# Patient Record
Sex: Male | Born: 1955
Health system: Southern US, Community
[De-identification: ages and names within clinical notes are randomized; demographics above are authoritative.]

## PROBLEM LIST (undated history)

## (undated) DIAGNOSIS — M779 Enthesopathy, unspecified: Secondary | ICD-10-CM

## (undated) DIAGNOSIS — K5790 Diverticulosis of intestine, part unspecified, without perforation or abscess without bleeding: Secondary | ICD-10-CM

## (undated) DIAGNOSIS — Z860100 Personal history of colon polyps, unspecified: Secondary | ICD-10-CM

## (undated) DIAGNOSIS — M1990 Unspecified osteoarthritis, unspecified site: Secondary | ICD-10-CM

## (undated) DIAGNOSIS — S83519A Sprain of anterior cruciate ligament of unspecified knee, initial encounter: Secondary | ICD-10-CM

## (undated) DIAGNOSIS — T7840XA Allergy, unspecified, initial encounter: Secondary | ICD-10-CM

## (undated) DIAGNOSIS — Z8719 Personal history of other diseases of the digestive system: Secondary | ICD-10-CM

## (undated) DIAGNOSIS — Z98811 Dental restoration status: Secondary | ICD-10-CM

## (undated) DIAGNOSIS — M545 Low back pain, unspecified: Secondary | ICD-10-CM

## (undated) DIAGNOSIS — C4491 Basal cell carcinoma of skin, unspecified: Secondary | ICD-10-CM

## (undated) DIAGNOSIS — J302 Other seasonal allergic rhinitis: Secondary | ICD-10-CM

## (undated) DIAGNOSIS — K219 Gastro-esophageal reflux disease without esophagitis: Secondary | ICD-10-CM

## (undated) DIAGNOSIS — G8929 Other chronic pain: Secondary | ICD-10-CM

## (undated) DIAGNOSIS — M722 Plantar fascial fibromatosis: Secondary | ICD-10-CM

## (undated) DIAGNOSIS — N400 Enlarged prostate without lower urinary tract symptoms: Secondary | ICD-10-CM

## (undated) HISTORY — PX: UMBILICAL HERNIA REPAIR: SHX196

## (undated) HISTORY — DX: Gastro-esophageal reflux disease without esophagitis: K21.9

## (undated) HISTORY — PX: HERNIA REPAIR: SHX51

## (undated) HISTORY — DX: Allergy, unspecified, initial encounter: T78.40XA

## (undated) HISTORY — PX: COLONOSCOPY: SHX174

## (undated) HISTORY — PX: OTHER SURGICAL HISTORY: SHX169

---

## 1992-08-12 HISTORY — PX: LEG SURGERY: SHX1003

## 2002-09-23 ENCOUNTER — Ambulatory Visit (HOSPITAL_BASED_OUTPATIENT_CLINIC_OR_DEPARTMENT_OTHER): Admission: RE | Admit: 2002-09-23 | Discharge: 2002-09-23 | Payer: Self-pay | Admitting: Orthopedic Surgery

## 2002-09-23 HISTORY — PX: KNEE ARTHROSCOPY: SHX127

## 2004-06-06 ENCOUNTER — Ambulatory Visit (HOSPITAL_COMMUNITY): Admission: RE | Admit: 2004-06-06 | Discharge: 2004-06-06 | Payer: Self-pay | Admitting: Sports Medicine

## 2006-12-14 ENCOUNTER — Encounter: Admission: RE | Admit: 2006-12-14 | Discharge: 2006-12-14 | Payer: Self-pay | Admitting: Sports Medicine

## 2007-01-28 HISTORY — PX: TOTAL SHOULDER ARTHROPLASTY: SHX126

## 2007-01-29 ENCOUNTER — Ambulatory Visit (HOSPITAL_BASED_OUTPATIENT_CLINIC_OR_DEPARTMENT_OTHER): Admission: RE | Admit: 2007-01-29 | Discharge: 2007-01-29 | Payer: Self-pay | Admitting: Orthopedic Surgery

## 2007-01-29 HISTORY — PX: KNEE ARTHROSCOPY W/ ACL RECONSTRUCTION: SHX1858

## 2009-06-13 ENCOUNTER — Encounter (INDEPENDENT_AMBULATORY_CARE_PROVIDER_SITE_OTHER): Payer: Self-pay | Admitting: Plastic Surgery

## 2009-06-13 ENCOUNTER — Ambulatory Visit (HOSPITAL_BASED_OUTPATIENT_CLINIC_OR_DEPARTMENT_OTHER): Admission: RE | Admit: 2009-06-13 | Discharge: 2009-06-13 | Payer: Self-pay | Admitting: Plastic Surgery

## 2010-12-25 NOTE — Op Note (Signed)
NAMEDAMERE, BRANDENBURG NO.:  [id number]   MEDICAL RECORD NO.:  [id number]          PATIENT TYPE:  AMB   LOCATION:  DSC                          FACILITY:  [facility]   PHYSICIAN:  Loreta Ave, M.D. DATE OF BIRTH:  1955-10-15   DATE OF PROCEDURE:  01/28/2007  DATE OF DISCHARGE:                               OPERATIVE REPORT   PREOPERATIVE DIAGNOSIS:  End-stage degenerative arthritis, left  shoulder.   POSTOPERATIVE DIAGNOSIS:  End-stage degenerative arthritis, left  shoulder.   OPERATION/PROCEDURE:  Left total shoulder replacement utilizing a  Stryker prosthesis.  Cemented #5 glenoid component.  Press-fit 12 mm  humeral stem with a 40 x 18 mm head with a 4 mm offset to center this.   SURGEON:  Loreta Ave, M.D.   ASSISTANT:  Genene Churn. Barry Dienes, P.A. present throughout the entire case.   ANESTHESIA:  General.   ESTIMATED BLOOD LOSS:  150 mL.   BLOOD REPLACED:  None.   SPECIMENS:  None.   CULTURES:  None.   COMPLICATIONS:  None.   DRESSINGS:  Soft, compressive with shoulder immobilizer.   DESCRIPTION OF PROCEDURE:  The patient was brought to the operating room  and after adequate anesthesia had been obtained, left shoulder examined.  Under anesthesia, very reasonable motion, lacking only about 10% in all  planes.  No instability.  Placed in beach chair position with a shoulder  positioner and prepped and draped in the usual sterile fashion.  An  incision was made along the deltopectoral groove.  Skin and subcutaneous  tissue divided.  Cephalic vein identified and protected.  Retractor put  in place for retracting the conjoin tendon. Rotator cuff intact.  Subscapularis was taken down, tagged with FiberWire for later anatomic  repair.  Shoulder exposed.  Humeral head was removed at 30-degree of  retroversion in line with the definitive prosthesis. Sized for a 40 x 18  mm head.  Grade 4 changes.  Biceps tendon protected.  Appropriate  retractors for  exposure, exposing the glenoid which was relatively small  with grade 4 changes as well.  This was reamed down to good bleeding  bone.  Sized for #5 glenoid which was the smallest component.  It was  predrilled for the component.  The bottom peg just barely exited out the  bottom of the glenoid and has a small, nondisplaced, little crack at the  posterior of inferior aspect of the glenoid, which did not compromise  stability at all.  The wound was thoroughly irrigated.  The component  put in place and found to fit well with good stability and seating.  Once this was confirmed, shoulder was copiously irrigated and cemented,  and the glenoid component was firmly cemented in place.  Excessive  cement was removed. This allowed excellent stability of the small crack  at the bottom of the glenoid and did not require any further treatment  and did not affect component stability.  Attention was then turned to  the humerus.  Hand held reamers were up.  Sized for #12 stem.  After  appropriate  trials, a #12 mm stem was seated at 30 degrees retroversion.  I then centered appropriate trial and then the definitive head over the  top of the stem component and hammered it in place.  This gave excellent  coverage, height and fixation.  A shoulder reduced.  Good motion and  stability.  The wound was irrigated.  Subscapularis appeared  anatomically with FiberWire.  Retractors were removed.  Deltopectoral  interval closed with Vicryl and skin with Vicryl and staples.  Margins  were injected with Marcaine.  Sterile compressive dressing applied.  Shoulder immobilizer applied.  Anesthesia reversed.  Brought to the  recovery room.  Tolerated the surgery with no complications.      Loreta Ave, M.D.  Electronically Signed     DFM/MEDQ  D:  01/29/2007  T:  01/29/2007  Job:  [id number]

## 2010-12-25 NOTE — Op Note (Signed)
NAMEREGGIE, BISE NO.:  [id number]   MEDICAL RECORD NO.:  [id number]          PATIENT TYPE:  AMB   LOCATION:  DSC                          FACILITY:  [facility]   PHYSICIAN:  Loreta Ave, M.D. DATE OF BIRTH:  [birth year]   DATE OF PROCEDURE:  01/29/2007  DATE OF DISCHARGE:                               OPERATIVE REPORT   PREOPERATIVE DIAGNOSES:  1. Right knee anterior cruciate ligament tear with anterolateral      __________ instability.  2. Degenerative change medial compartment.   POSTOPERATIVE DIAGNOSES:  1. Right knee anterior cruciate ligament tear with      anterolateral__________ instability.  2. Degenerative change medial compartment.  3. Grade 2 and mild grade 3 thinning, medial compartment, mostly on      the tibial plateau.  4. Radial tears, mid portion lateral meniscus.   OPERATION/PROCEDURE:  1. Right knee examination under anesthesia.  2. Arthroscopy.  3. Debridement of lateral meniscus.  4. Arthroscopic endoscopic anterior cruciate ligament reconstruction.  5. Patella tendon allograft, bone-tendon-bone with bioabsorbable screw      fixation and notch plasty.  .   SURGEON:  Loreta Ave, M.D.   ASSISTANT:  Genene Churn. Barry Dienes, P.A. present throughout the entire case.   ANESTHESIA:  General.   ESTIMATED BLOOD LOSS:  Minimal.   TOURNIQUET TIME:  One hour.   SPECIMENS:  None.   CULTURES:  None.   DRESSINGS:  Soft, compressive with knee immobilizer.   DESCRIPTION OF PROCEDURE:  The patient was brought to the operating  table in the supine position.  After adequate anesthesia was obtained,  right knee was examined.  Positive Lachman's, positive drawer, positive  pivot change, trace amount of hyperextension. Otherwise no motion.  Other ligaments stable.  Tourniquet applied.  Prepped and draped in the  usual sterile fashion.  Exsanguinated and elevated with the Esmarch,  tourniquet inflated to 350 mmHg.  Three portals were created.   One  superolateral, one each medial and lateral parapatellar.  Inflow  catheter introduced.  Knee was distended. Arthroscope introduced and the  knee inspected.  Patellofemoral joint looked good with good tracking.  No degenerative changes.  Lateral compartment, no degenerative changes,  but radial tears mid portion of the lateral meniscus.  __________  retaining fair amount of meniscus at completion.  Anterior cruciate  ligament had a few remaining fibers completely nonfunctional, debrided.  Notch opened with notchplasty.  Posterior cruciate ligament intact.  Medially he had had a previous partial medial meniscectomy.  No  recurrent tears.  No significant erosions all the way through but just  diffuse grade 2 thinning grade 3 in the back of the plateau.  Once the  entire knee had been assessed, allograft was prepared for 10 mm tunnels,  bone-tendon-bone.  With the arthroscope in place, tunnels were created.  A small incision next to the tibial tubercle, guidewire passed and found  to be in good position.  We had a foot print of the anterior cruciate  ligament. Over drilled with 10 mm reamer.  Debris cleared with the  shaver.  Femoral guide was inserted across tibial tunnel, notched under  the back cortex femur.  K-wire driven and overdrilled with a 10 mm  reamer for appropriate depth for the graft and pegs.  Debris cleared  throughout.  Tunnels assessed and found to be in good position.  Tube  did not pass __________  cervical os both tunnels and out through a stab  wound in the anterolateral thigh.  A Niconyl's wire brought into the  medial portal and out through the femoral tunnel.  Graft pulled in  across the knee, __________  in both the tibial and femoral tunnels.  Fixed in the femoral tunnel with an 8 x 25 bioabsorbable screw placed  over 9 nitinol  wire.  A __________ fixation.  Knee placed to 70 degrees  flexion.  Posterior drawer applied.  Graft pulled tightly and fixed in  the  tibial tunnel over a nitinol wire with a  9 x 25 bioabsorbable  screw.  Good good capture and fixation of the graft above and below.  Negative Lachman's, negative drawer.  Good stability.  Good clearance to  the graft when viewed through full motion.  All nitinol wires in the  incisions have been removed.  The wounds were all irrigated.  Incision  closed with subcutaneous and subcuticular Vicryl.  Portals closed with  nylon.  Knee injected with Marcaine.  Sterile compressive dressing  applied.  Tourniquet inflated and removed.  Knee immobilizer applied.  Anesthesia reversed.  Brought to the recovery room.  Tolerated the  surgery with no complications.      Loreta Ave, M.D.  Electronically Signed     DFM/MEDQ  D:  01/29/2007  T:  01/29/2007  Job:  [id number]

## 2010-12-28 NOTE — Op Note (Signed)
   NAME:  Patrick [name], Patrick [name] NO.:  [id number]   MEDICAL RECORD NO.:  [id number]                   PATIENT TYPE:  AMB   LOCATION:  DSC                                  FACILITY:  [facility]   PHYSICIAN:  Loreta Ave, M.D.              DATE OF BIRTH:  [birth year]   DATE OF PROCEDURE:  09/23/2002  DATE OF DISCHARGE:                                 OPERATIVE REPORT   PREOPERATIVE DIAGNOSES:  Medial meniscal tear with chondromalacia of the  lateral compartment of the right knee.   POSTOPERATIVE DIAGNOSES:  Medial and lateral meniscal tear with grade 2  chondromalacia of the lateral tibial plateau.   OPERATION PERFORMED:  Right knee examination under anesthesia, arthroscopy,  partial medial and lateral meniscectomy, chondroplasty, lateral tibial  plateau.   SURGEON:  Loreta Ave, M.D.   ASSISTANT:  Arlys John D. Petrarca, P.A.-C.   ANESTHESIA:  Knee block with sedation.   SPECIMENS:  None.   CULTURES:  None.   COMPLICATIONS:  None.   DRESSING:  Soft compressive.   DESCRIPTION OF PROCEDURE:  The patient was brought to the operating room and  placed on the operating table in supine position.  After adequate anesthesia  had been obtained, the right knee examined.  Full motion, good stability,  good patellofemoral tracking.  Tourniquet and leg holder applied.  Leg  prepped and draped in the usual sterile fashion.  Three portals were  created, one superolateral, one each medial and lateral parapatellar.  Inflow catheter introduced.  Knee distended.  Arthroscope introduced and  knee inspected.  Good patellofemoral cartilage and tracking.  No plica.  Medial compartment revealed extensive complex tearing of medial meniscus,  posterior half.  Saucerized out to a stable rim and tapered down to  remaining meniscus salvaging the anterior half.  No degenerative changes.  Cruciate ligaments intact.  Lateral compartment revealed flat tear off the  middle third  lateral meniscus, saucerized out, tapered in smoothly.  Some  grade 2 changes on the plateau treated with chondroplasty.  Entire knee  examined.  No other findings appreciated.  Instruments and fluid removed.  Portals of the knee injected with Marcaine.  Portals were closed with 4-0  nylon.  Sterile compressive dressing applied.  Anesthesia reversed.  Brought  to recovery room.  Tolerated surgery well.  No complications.                                                  Loreta Ave, M.D.    DFM/MEDQ  D:  09/23/2002  T:  09/23/2002  Job:  [id number]

## 2011-05-29 LAB — POCT HEMOGLOBIN-HEMACUE
Hemoglobin: 15.8
Operator id: 123881

## 2012-01-06 ENCOUNTER — Encounter (HOSPITAL_BASED_OUTPATIENT_CLINIC_OR_DEPARTMENT_OTHER): Payer: Self-pay | Admitting: *Deleted

## 2012-01-06 ENCOUNTER — Emergency Department (HOSPITAL_BASED_OUTPATIENT_CLINIC_OR_DEPARTMENT_OTHER): Payer: BC Managed Care – PPO

## 2012-01-06 ENCOUNTER — Emergency Department (HOSPITAL_BASED_OUTPATIENT_CLINIC_OR_DEPARTMENT_OTHER)
Admission: EM | Admit: 2012-01-06 | Discharge: 2012-01-07 | Disposition: A | Discharge status: Home / Self Care | Payer: BC Managed Care – PPO | Attending: Emergency Medicine | Admitting: Emergency Medicine

## 2012-01-06 DIAGNOSIS — K5732 Diverticulitis of large intestine without perforation or abscess without bleeding: Secondary | ICD-10-CM | POA: Insufficient documentation

## 2012-01-06 DIAGNOSIS — K5792 Diverticulitis of intestine, part unspecified, without perforation or abscess without bleeding: Secondary | ICD-10-CM

## 2012-01-06 DIAGNOSIS — M545 Low back pain, unspecified: Secondary | ICD-10-CM

## 2012-01-06 DIAGNOSIS — R109 Unspecified abdominal pain: Secondary | ICD-10-CM | POA: Insufficient documentation

## 2012-01-06 LAB — URINALYSIS, ROUTINE W REFLEX MICROSCOPIC
Bilirubin Urine: NEGATIVE
Glucose, UA: NEGATIVE mg/dL
Hgb urine dipstick: NEGATIVE
Ketones, ur: NEGATIVE mg/dL
Leukocytes, UA: NEGATIVE
Nitrite: NEGATIVE
Protein, ur: NEGATIVE mg/dL
Specific Gravity, Urine: 1.015 (ref 1.005–1.030)
Urobilinogen, UA: 0.2 mg/dL (ref 0.0–1.0)
pH: 6.5 (ref 5.0–8.0)

## 2012-01-06 NOTE — ED Notes (Signed)
Left side back pain x 1 day- denies injury

## 2012-01-06 NOTE — ED Provider Notes (Signed)
History  This chart was scribed for Dione Booze, MD by Cherlynn Perches. The patient was seen in room MH05/MH05. Patient's care was started at 2322.  CSN: [id number]  Arrival date & time 01/06/12  2322   First MD Initiated Contact with Patient 01/06/12 2333      Chief Complaint  Patient presents with  . Back Pain    (Consider location/radiation/quality/duration/timing/severity/associated sxs/prior treatment) HPI  Patrick [name] is a 56 y.o. male who presents to the Emergency Department complaining of 1 day of rapidly worsening, constant, "6/10" back pain localized to the lower left back with associated left flank pain. Pt reports [place] and his back felt "tight" which is not unusual. Pt states that pain has been rapidly worsening since onset and left flank pain began about 12 hours ago. Pt reports that pain is worse when moving legs and changing from sitting to standing. Pt denies nausea, vomiting, dysuria, and hematuria. Pt denies smoking and alcohol use. Pt has no h/o kidney stones or diverticulitis.   PCP - Dr. Timothy Lasso   History reviewed. No pertinent past medical history.  Past Surgical History  Procedure Date  . Knee surgery   . Shoulder surgery   . Leg surgery     No family history on file.  History  Substance Use Topics  . Smoking status: Not on file  . Smokeless tobacco: Not on file  . Alcohol Use:       Review of Systems  Constitutional: Negative for fever and chills.  HENT: Negative for ear pain and neck pain.   Respiratory: Negative for cough and shortness of breath.   Cardiovascular: Negative for chest pain.  Gastrointestinal: Negative for nausea, vomiting, abdominal pain and diarrhea.  Genitourinary: Positive for flank pain. Negative for dysuria.  Musculoskeletal: Positive for back pain. Negative for joint swelling.  Skin: Negative for rash.  All other systems reviewed and are negative.    Allergies    Penicillins  Home Medications   Current Outpatient Rx  Name Route Sig Dispense Refill  . TRAMADOL HCL 50 MG PO TABS Oral Take 50 mg by mouth every 6 (six) hours as needed.      BP 105/69  Pulse 80  Temp(Src) 98.6 F (37 C) (Oral)  Resp 18  SpO2 100%  Physical Exam  Nursing note and vitals reviewed. Constitutional: He is oriented to person, place, and time. He appears well-developed and well-nourished.  HENT:  Head: Normocephalic and atraumatic.  Eyes: Conjunctivae and EOM are normal. No scleral icterus.  Neck: Normal range of motion. Neck supple.  Cardiovascular: Normal rate and regular rhythm.   Pulmonary/Chest: Effort normal. No respiratory distress.  Abdominal: Soft. He exhibits no distension. There is tenderness (mild LLQ tenderness). There is no rebound.  Musculoskeletal: He exhibits no edema.       positive straight leg raise on left at 45 degrees  Neurological: He is alert and oriented to person, place, and time.  Skin: Skin is warm and dry.  Psychiatric: He has a normal mood and affect. His behavior is normal.    ED Course  Procedures (including critical care time)  DIAGNOSTIC STUDIES: Oxygen Saturation is 100% on room air, normal by my interpretation.    COORDINATION OF CARE: 11:37PM - Will get urine sample and CT scan. Pt agrees with plan.    Results for orders placed during the hospital encounter of 01/06/12  URINALYSIS, ROUTINE W REFLEX MICROSCOPIC  Component Value Range   Color, Urine YELLOW  YELLOW    APPearance CLEAR  CLEAR    Specific Gravity, Urine 1.015  1.005 - 1.030    pH 6.5  5.0 - 8.0    Glucose, UA NEGATIVE  NEGATIVE (mg/dL)   Hgb urine dipstick NEGATIVE  NEGATIVE    Bilirubin Urine NEGATIVE  NEGATIVE    Ketones, ur NEGATIVE  NEGATIVE (mg/dL)   Protein, ur NEGATIVE  NEGATIVE (mg/dL)   Urobilinogen, UA 0.2  0.0 - 1.0 (mg/dL)   Nitrite NEGATIVE  NEGATIVE    Leukocytes, UA NEGATIVE  NEGATIVE    Ct Abdomen Pelvis Wo  Contrast  01/06/2012  *RADIOLOGY REPORT*  Clinical Data: Left flank pain for several days.  CT ABDOMEN AND PELVIS WITHOUT CONTRAST  Technique:  Multidetector CT imaging of the abdomen and pelvis was performed following the standard protocol without intravenous contrast.  Comparison: None.  Findings: The lung bases are clear.  The kidneys appear symmetrical.  No pyelocaliectasis or ureterectasis.  No renal, ureteral, or bladder stones.  The bladder is decompressed and cannot be evaluated for wall thickness.  Diverticulosis of the colon with infiltrative changes around the descending colon consistent with descending colonic diverticulitis. No discrete abscess is demonstrated.  The gallbladder is decompressed.  The unenhanced appearance of the liver, spleen, pancreas, adrenal glands, abdominal aorta, and retroperitoneal lymph nodes is unremarkable.  The stomach and small bowel are not distended.  Stool filled colon.  No free air or free fluid in the abdomen.  Pelvis:  Mild prostate enlargement, measuring 4.2 x 5 cm. Prostatic calcification.  Small fat containing left inguinal hernia.  No free or loculated pelvic fluid collections.  The appendix is normal.  Degenerative changes in the lumbar spine.  IMPRESSION: No renal or ureteral stone or obstruction.  Diverticulosis of the colon with pericolonic infiltration around the descending colon suggesting descending colonic diverticulitis.  Original Report Authenticated By: Marlon Pel, M.D.      1. Diverticulitis   2. Low back pain       MDM  Flank pain and the lower abdominal pain which may represent ureterolithiasis. Possible urinary tract infection. Also consider diverticulitis. Urinalysis and CT scan will be obtained.   CT shows diverticulitis and urinalysis is negative. He is sent home with prescriptions for ciprofloxacin, metronidazole, and Percocet for pain.    I personally performed the services described in this documentation, which was  scribed in my presence. The recorded information has been reviewed and considered.      Dione Booze, MD 01/09/12 1056

## 2012-01-07 MED ORDER — METRONIDAZOLE 500 MG PO TABS
500.0000 mg | ORAL_TABLET | Freq: Once | ORAL | Status: AC
  Administered 2012-01-07: 500 mg via ORAL
  Filled 2012-01-07: qty 1

## 2012-01-07 MED ORDER — OXYCODONE-ACETAMINOPHEN 5-325 MG PO TABS: 1.0000 | ORAL_TABLET | ORAL | Status: AC | PRN

## 2012-01-07 MED ORDER — METRONIDAZOLE 500 MG PO TABS: 500.0000 mg | ORAL_TABLET | Freq: Three times a day (TID) | ORAL | Status: AC

## 2012-01-07 MED ORDER — CIPROFLOXACIN HCL 500 MG PO TABS
500.0000 mg | ORAL_TABLET | Freq: Once | ORAL | Status: AC
  Administered 2012-01-07: 500 mg via ORAL
  Filled 2012-01-07: qty 1

## 2012-01-07 MED ORDER — CIPROFLOXACIN HCL 500 MG PO TABS: 500.0000 mg | ORAL_TABLET | Freq: Two times a day (BID) | ORAL | Status: AC

## 2012-01-07 NOTE — Discharge Instructions (Signed)
Diverticulitis A diverticulum is a small pouch or sac on the colon. Diverticulosis is the presence of these diverticula on the colon. Diverticulitis is the irritation (inflammation) or infection of diverticula. CAUSES  The colon and its diverticula contain bacteria. If food particles block the tiny opening to a diverticulum, the bacteria inside can grow and cause an increase in pressure. This leads to infection and inflammation and is called diverticulitis. SYMPTOMS   Abdominal pain and tenderness. Usually, the pain is located on the left side of your abdomen. However, it could be located elsewhere.   Fever.   Bloating.   Feeling sick to your stomach (nausea).   Throwing up (vomiting).   Abnormal stools.  DIAGNOSIS  Your caregiver will take a history and perform a physical exam. Since many things can cause abdominal pain, other tests may be necessary. Tests may include:  Blood tests.   Urine tests.   X-ray of the abdomen.   CT scan of the abdomen.  Sometimes, surgery is needed to determine if diverticulitis or other conditions are causing your symptoms. TREATMENT  Most of the time, you can be treated without surgery. Treatment includes:  Resting the bowels by only having liquids for a few days. As you improve, you will need to eat a low-fiber diet.   Intravenous (IV) fluids if you are losing body fluids (dehydrated).   Antibiotic medicines that treat infections may be given.   Pain and nausea medicine, if needed.   Surgery if the inflamed diverticulum has burst.  HOME CARE INSTRUCTIONS   Try a clear liquid diet (broth, tea, or water for as long as directed by your caregiver). You may then gradually begin a low-fiber diet as tolerated. A low-fiber diet is a diet with less than 10 grams of fiber. Choose the foods below to reduce fiber in the diet:   White breads, cereals, rice, and pasta.   Cooked fruits and vegetables or soft fresh fruits and vegetables without the skin.     Ground or well-cooked tender beef, ham, veal, lamb, pork, or poultry.   Eggs and seafood.   After your diverticulitis symptoms have improved, your caregiver may put you on a high-fiber diet. A high-fiber diet includes 14 grams of fiber for every 1000 calories consumed. For a standard 2000 calorie diet, you would need 28 grams of fiber. Follow these diet guidelines to help you increase the fiber in your diet. It is important to slowly increase the amount fiber in your diet to avoid gas, constipation, and bloating.   Choose whole-grain breads, cereals, pasta, and brown rice.   Choose fresh fruits and vegetables with the skin on. Do not overcook vegetables because the more vegetables are cooked, the more fiber is lost.   Choose more nuts, seeds, legumes, dried peas, beans, and lentils.   Look for food products that have greater than 3 grams of fiber per serving on the Nutrition Facts label.   Take all medicine as directed by your caregiver.   If your caregiver has given you a follow-up appointment, it is very important that you go. Not going could result in lasting (chronic) or permanent injury, pain, and disability. If there is any problem keeping the appointment, call to reschedule.  SEEK MEDICAL CARE IF:   Your pain does not improve.   You have a hard time advancing your diet beyond clear liquids.   Your bowel movements do not return to normal.  SEEK IMMEDIATE MEDICAL CARE IF:   Your pain becomes   worse.   You have an oral temperature above 102 F (38.9 C), not controlled by medicine.   You have repeated vomiting.   You have bloody or black, tarry stools.   Symptoms that brought you to your caregiver become worse or are not getting better.  MAKE SURE YOU:   Understand these instructions.   Will watch your condition.   Will get help right away if you are not doing well or get worse.  Document Released: 05/08/2005 Document Revised: 07/18/2011 Document Reviewed:  09/03/2010 [facility] Patient Information 2012 [place], [place].  Back Pain, Adult Low back pain is very common. About 1 in 5 people have back pain.The cause of low back pain is rarely dangerous. The pain often gets better over time.About half of people with a sudden onset of back pain feel better in just 2 weeks. About 8 in 10 people feel better by 6 weeks.  CAUSES Some common causes of back pain include:  Strain of the muscles or ligaments supporting the spine.   Wear and tear (degeneration) of the spinal discs.   Arthritis.   Direct injury to the back.  DIAGNOSIS Most of the time, the direct cause of low back pain is not known.However, back pain can be treated effectively even when the exact cause of the pain is unknown.Answering your caregiver's questions about your overall health and symptoms is one of the most accurate ways to make sure the cause of your pain is not dangerous. If your caregiver needs more information, he or she may order lab work or imaging tests (X-rays or MRIs).However, even if imaging tests show changes in your back, this usually does not require surgery. HOME CARE INSTRUCTIONS For many people, back pain returns.Since low back pain is rarely dangerous, it is often a condition that people can learn to [facility] their own.   Remain active. It is stressful on the back to sit or stand in one place. Do not sit, drive, or stand in one place for more than 30 minutes at a time. Take short walks on level surfaces as soon as pain allows.Try to increase the length of time you walk each day.   Do not stay in bed.Resting more than 1 or 2 days can delay your recovery.   Do not avoid exercise or work.Your body is made to move.It is not dangerous to be active, even though your back may hurt.Your back will likely heal faster if you return to being active before your pain is gone.   Pay attention to your body when you bend and lift. Many people have less discomfortwhen  lifting if they bend their knees, keep the load close to their bodies,and avoid twisting. Often, the most comfortable positions are those that put less stress on your recovering back.   Find a comfortable position to sleep. Use a firm mattress and lie on your side with your knees slightly bent. If you lie on your back, put a pillow under your knees.   Only take over-the-counter or prescription medicines as directed by your caregiver. Over-the-counter medicines to reduce pain and inflammation are often the most helpful.Your caregiver may prescribe muscle relaxant drugs.These medicines help dull your pain so you can more quickly return to your normal activities and healthy exercise.   Put ice on the injured area.   Put ice in a plastic bag.   Place a towel between your skin and the bag.   Leave the ice on for 15 to 20 minutes, 3 to 4  times a day for the first 2 to 3 days. After that, ice and heat may be alternated to reduce pain and spasms.   Ask your caregiver about trying back exercises and gentle massage. This may be of some benefit.   Avoid feeling anxious or stressed.Stress increases muscle tension and can worsen back pain.It is important to recognize when you are anxious or stressed and learn ways to manage it.Exercise is a great option.  SEEK MEDICAL CARE IF:  You have pain that is not relieved with rest or medicine.   You have pain that does not improve in 1 week.   You have new symptoms.   You are generally not feeling well.  SEEK IMMEDIATE MEDICAL CARE IF:   You have pain that radiates from your back into your legs.   You develop new bowel or bladder control problems.   You have unusual weakness or numbness in your arms or legs.   You develop nausea or vomiting.   You develop abdominal pain.   You feel faint.  Document Released: 07/29/2005 Document Revised: 07/18/2011 Document Reviewed: 12/17/2010 [facility] Patient Information 2012 [place],  [place].  Ciprofloxacin tablets What is this medicine? CIPROFLOXACIN (sip roe FLOX a sin) is a quinolone antibiotic. It is used to treat certain kinds of bacterial infections. It will not work for colds, flu, or other viral infections. This medicine may be used for other purposes; ask your health care provider or pharmacist if you have questions. What should I tell my health care provider before I take this medicine? They need to know if you have any of these conditions: -heart condition -joint problems -kidney disease -liver disease -myasthenia gravis -seizure disorder -an unusual or allergic reaction to ciprofloxacin, other antibiotics or medicines, foods, dyes, or preservatives -pregnant or trying to get pregnant -breast-feeding How should I use this medicine? Take this medicine by mouth with a glass of water. Follow the directions on the prescription label. Take your medicine at regular intervals. Do not take your medicine more often than directed. Take all of your medicine as directed even if you think your are better. Do not skip doses or stop your medicine early. You can take this medicine with food or on an empty stomach. It can be taken with a meal that contains dairy or calcium, but do not take it alone with a dairy product, like milk or yogurt or calcium-fortified juice. A special MedGuide will be given to you by the pharmacist with each prescription and refill. Be sure to read this information carefully each time. Talk to your pediatrician regarding the use of this medicine in children. Special care may be needed. Overdosage: If you think you have taken too much of this medicine contact a poison control center or emergency room at once. NOTE: This medicine is only for you. Do not share this medicine with others. What if I miss a dose? If you miss a dose, take it as soon as you can. If it is almost time for your next dose, take only that dose. Do not take double or extra doses. What  may interact with this medicine? Do not take this medicine with any of the following medications: -cisapride -droperidol -terfenadine -tizanidine This medicine may also interact with the following medications: -antacids -caffeine -cyclosporin -didanosine (ddI) buffered tablets or powder -medicines for diabetes -medicines for inflammation like ibuprofen, naproxen -methotrexate -multivitamins -omeprazole -phenytoin -probenecid -sucralfate -theophylline -warfarin This list may not describe all possible interactions. Give your health care provider a  list of all the medicines, herbs, non-prescription drugs, or dietary supplements you use. Also tell them if you smoke, drink alcohol, or use illegal drugs. Some items may interact with your medicine. What should I watch for while using this medicine? Tell your doctor or health care professional if your symptoms do not improve. Do not treat diarrhea with over the counter products. Contact your doctor if you have diarrhea that lasts more than 2 days or if it is severe and watery. You may get drowsy or dizzy. Do not drive, use machinery, or do anything that needs mental alertness until you know how this medicine affects you. Do not stand or sit up quickly, especially if you are an older patient. This reduces the risk of dizzy or fainting spells. This medicine can make you more sensitive to the sun. Keep out of the sun. If you cannot avoid being in the sun, wear protective clothing and use sunscreen. Do not use sun lamps or tanning beds/booths. Avoid antacids, aluminum, calcium, iron, magnesium, and zinc products for 6 hours before and 2 hours after taking a dose of this medicine. What side effects may I notice from receiving this medicine? Side effects that you should report to your doctor or health care professional as soon as possible: -allergic reactions like skin rash, itching or hives, swelling of the face, lips, or tongue -breathing  problems -confusion, nightmares or hallucinations -feeling faint or lightheaded, falls -irregular heartbeat -joint, muscle or tendon pain or swelling -pain or trouble passing urine -redness, blistering, peeling or loosening of the skin, including inside the mouth -seizure -unusual pain, numbness, tingling, or weakness Side effects that usually do not require medical attention (report to your doctor or health care professional if they continue or are bothersome): -diarrhea -nausea or stomach upset -white patches or sores in the mouth This list may not describe all possible side effects. Call your doctor for medical advice about side effects. You may report side effects to FDA at 1-800-FDA-1088. Where should I keep my medicine? Keep out of the reach of children. Store at room temperature below 30 degrees C (86 degrees F). Keep container tightly closed. Throw away any unused medicine after the expiration date. NOTE: This sheet is a summary. It may not cover all possible information. If you have questions about this medicine, talk to your doctor, pharmacist, or health care provider.  2012, [employer]. (10/16/2009 11:41:11 AM)  Metronidazole tablets or capsules What is this medicine? METRONIDAZOLE (me troe NI da zole) is an antiinfective. It is used to treat certain kinds of bacterial and protozoal infections. It will not work for colds, flu, or other viral infections. This medicine may be used for other purposes; ask your health care provider or pharmacist if you have questions. What should I tell my health care provider before I take this medicine? They need to know if you have any of these conditions: -anemia or other blood disorders -disease of the nervous system -fungal or yeast infection -if you drink alcohol containing drinks -liver disease -seizures -an unusual or allergic reaction to metronidazole, or other medicines, foods, dyes, or preservatives -pregnant or trying  to get pregnant -breast-feeding How should I use this medicine? Take this medicine by mouth with a full glass of water. Follow the directions on the prescription label. Take your medicine at regular intervals. Do not take your medicine more often than directed. Take all of your medicine as directed even if you think you are better. Do not skip doses  or stop your medicine early. Talk to your pediatrician regarding the use of this medicine in children. Special care may be needed. Overdosage: If you think you have taken too much of this medicine contact a poison control center or emergency room at once. NOTE: This medicine is only for you. Do not share this medicine with others. What if I miss a dose? If you miss a dose, take it as soon as you can. If it is almost time for your next dose, take only that dose. Do not take double or extra doses. What may interact with this medicine? Do not take this medicine with any of the following medications: -alcohol or any product that contains alcohol -amprenavir oral solution -disulfiram -paclitaxel injection -ritonavir oral solution -sertraline oral solution -sulfamethoxazole-trimethoprim injection This medicine may also interact with the following medications: -cimetidine -lithium -phenobarbital -phenytoin -warfarin This list may not describe all possible interactions. Give your health care provider a list of all the medicines, herbs, non-prescription drugs, or dietary supplements you use. Also tell them if you smoke, drink alcohol, or use illegal drugs. Some items may interact with your medicine. What should I watch for while using this medicine? Tell your doctor or health care professional if your symptoms do not improve or if they get worse. You may get drowsy or dizzy. Do not drive, use machinery, or do anything that needs mental alertness until you know how this medicine affects you. Do not stand or sit up quickly, especially if you are an older  patient. This reduces the risk of dizzy or fainting spells. Avoid alcoholic drinks while you are taking this medicine and for three days afterward. Alcohol may make you feel dizzy, sick, or flushed. If you are being treated for a sexually transmitted disease, avoid sexual contact until you have finished your treatment. Your sexual partner may also need treatment. What side effects may I notice from receiving this medicine? Side effects that you should report to your doctor or health care professional as soon as possible: -allergic reactions like skin rash or hives, swelling of the face, lips, or tongue -confusion, clumsiness -difficulty speaking -discolored or sore mouth -dizziness -fever, infection -numbness, tingling, pain or weakness in the hands or feet -trouble passing urine or change in the amount of urine -redness, blistering, peeling or loosening of the skin, including inside the mouth -seizures -unusually weak or tired -vaginal irritation, dryness, or discharge Side effects that usually do not require medical attention (report to your doctor or health care professional if they continue or are bothersome): -diarrhea -headache -irritability -metallic taste -nausea -stomach pain or cramps -trouble sleeping This list may not describe all possible side effects. Call your doctor for medical advice about side effects. You may report side effects to FDA at 1-800-FDA-1088. Where should I keep my medicine? Keep out of the reach of children. Store at room temperature below 25 degrees C (77 degrees F). Protect from light. Keep container tightly closed. Throw away any unused medicine after the expiration date. NOTE: This sheet is a summary. It may not cover all possible information. If you have questions about this medicine, talk to your doctor, pharmacist, or health care provider.  2012, [employer]. (05/16/2008 10:37:11 AM)  Acetaminophen; Oxycodone tablets What is this  medicine? ACETAMINOPHEN; OXYCODONE (a set a MEE noe fen; ox i KOE done) is a pain reliever. It is used to treat mild to moderate pain. This medicine may be used for other purposes; ask your health care provider or  pharmacist if you have questions. What should I tell my health care provider before I take this medicine? They need to know if you have any of these conditions: -brain tumor -Crohn's disease, inflammatory bowel disease, or ulcerative colitis -drink more than 3 alcohol containing drinks per day -drug abuse or addiction -head injury -heart or circulation problems -kidney disease or problems going to the bathroom -liver disease -lung disease, asthma, or breathing problems -an unusual or allergic reaction to acetaminophen, oxycodone, other opioid analgesics, other medicines, foods, dyes, or preservatives -pregnant or trying to get pregnant -breast-feeding How should I use this medicine? Take this medicine by mouth with a full glass of water. Follow the directions on the prescription label. Take your medicine at regular intervals. Do not take your medicine more often than directed. Talk to your pediatrician regarding the use of this medicine in children. Special care may be needed. Patients over 32 years old may have a stronger reaction and need a smaller dose. Overdosage: If you think you have taken too much of this medicine contact a poison control center or emergency room at once. NOTE: This medicine is only for you. Do not share this medicine with others. What if I miss a dose? If you miss a dose, take it as soon as you can. If it is almost time for your next dose, take only that dose. Do not take double or extra doses. What may interact with this medicine? -alcohol or medicines that contain alcohol -antihistamines -barbiturates like amobarbital, butalbital, butabarbital, methohexital, pentobarbital, phenobarbital, thiopental, and secobarbital -benztropine -drugs for bladder  problems like solifenacin, trospium, oxybutynin, tolterodine, hyoscyamine, and methscopolamine -drugs for breathing problems like ipratropium and tiotropium -drugs for certain stomach or intestine problems like propantheline, homatropine methylbromide, glycopyrrolate, atropine, belladonna, and dicyclomine -general anesthetics like etomidate, ketamine, nitrous oxide, propofol, desflurane, enflurane, halothane, isoflurane, and sevoflurane -medicines for depression, anxiety, or psychotic disturbances -medicines for pain like codeine, morphine, pentazocine, buprenorphine, butorphanol, nalbuphine, tramadol, and propoxyphene -medicines for sleep -muscle relaxants -naltrexone -phenothiazines like perphenazine, thioridazine, chlorpromazine, mesoridazine, fluphenazine, prochlorperazine, promazine, and trifluoperazine -scopolamine -trihexyphenidyl This list may not describe all possible interactions. Give your health care provider a list of all the medicines, herbs, non-prescription drugs, or dietary supplements you use. Also tell them if you smoke, drink alcohol, or use illegal drugs. Some items may interact with your medicine. What should I watch for while using this medicine? Tell your doctor or health care professional if your pain does not go away, if it gets worse, or if you have new or a different type of pain. You may develop tolerance to the medicine. Tolerance means that you will need a higher dose of the medication for pain relief. Tolerance is normal and is expected if you take this medicine for a long time. Do not suddenly stop taking your medicine because you may develop a severe reaction. Your body becomes used to the medicine. This does NOT mean you are addicted. Addiction is a behavior related to getting and using a drug for a nonmedical reason. If you have pain, you have a medical reason to take pain medicine. Your doctor will tell you how much medicine to take. If your doctor wants you to  stop the medicine, the dose will be slowly lowered over time to avoid any side effects. You may get drowsy or dizzy. Do not drive, use machinery, or do anything that needs mental alertness until you know how this medicine affects you. Do not stand or sit up  quickly, especially if you are an older patient. This reduces the risk of dizzy or fainting spells. Alcohol may interfere with the effect of this medicine. Avoid alcoholic drinks. The medicine will cause constipation. Try to have a bowel movement at least every 2 to 3 days. If you do not have a bowel movement for 3 days, call your doctor or health care professional. Do not take Tylenol (acetaminophen) or medicines that have acetaminophen with this medicine. Too much acetaminophen can be very dangerous. Many nonprescription medicines contain acetaminophen. Always read the labels carefully to avoid taking more acetaminophen. What side effects may I notice from receiving this medicine? Side effects that you should report to your doctor or health care professional as soon as possible: -allergic reactions like skin rash, itching or hives, swelling of the face, lips, or tongue -breathing difficulties, wheezing -confusion -light headedness or fainting spells -severe stomach pain -yellowing of the skin or the whites of the eyes Side effects that usually do not require medical attention (report to your doctor or health care professional if they continue or are bothersome): -dizziness -drowsiness -nausea -vomiting This list may not describe all possible side effects. Call your doctor for medical advice about side effects. You may report side effects to FDA at 1-800-FDA-1088. Where should I keep my medicine? Keep out of the reach of children. This medicine can be abused. Keep your medicine in a safe place to protect it from theft. Do not share this medicine with anyone. Selling or giving away this medicine is dangerous and against the law. Store at room  temperature between 20 and 25 degrees C (68 and 77 degrees F). Keep container tightly closed. Protect from light. Flush any unused medicines down the toilet. Do not use the medicine after the expiration date. NOTE: This sheet is a summary. It may not cover all possible information. If you have questions about this medicine, talk to your doctor, pharmacist, or health care provider.  2012, [employer] Standard. (06/27/2008 10:01:21 AM)

## 2012-08-31 ENCOUNTER — Encounter: Payer: Self-pay | Admitting: Internal Medicine

## 2012-10-08 ENCOUNTER — Ambulatory Visit (AMBULATORY_SURGERY_CENTER): Payer: BC Managed Care – PPO | Admitting: *Deleted

## 2012-10-08 VITALS — Ht 73.0 in | Wt 194.2 lb

## 2012-10-08 DIAGNOSIS — Z1211 Encounter for screening for malignant neoplasm of colon: Secondary | ICD-10-CM

## 2012-10-08 MED ORDER — MOVIPREP 100 G PO SOLR: ORAL | Status: DC

## 2012-10-15 ENCOUNTER — Ambulatory Visit (AMBULATORY_SURGERY_CENTER): Payer: BC Managed Care – PPO | Admitting: Internal Medicine

## 2012-10-15 ENCOUNTER — Encounter: Payer: Self-pay | Admitting: Internal Medicine

## 2012-10-15 VITALS — BP 123/50 | HR 64 | Temp 98.1°F | Resp 15 | Ht 73.0 in | Wt 194.0 lb

## 2012-10-15 DIAGNOSIS — D126 Benign neoplasm of colon, unspecified: Secondary | ICD-10-CM

## 2012-10-15 DIAGNOSIS — Z1211 Encounter for screening for malignant neoplasm of colon: Secondary | ICD-10-CM

## 2012-10-15 DIAGNOSIS — K5730 Diverticulosis of large intestine without perforation or abscess without bleeding: Secondary | ICD-10-CM

## 2012-10-15 HISTORY — PX: COLONOSCOPY WITH PROPOFOL: SHX5780

## 2012-10-15 MED ORDER — SODIUM CHLORIDE 0.9 % IV SOLN: 500.0000 mL | INTRAVENOUS | Status: DC

## 2012-10-15 NOTE — Patient Instructions (Addendum)
YOU HAD AN ENDOSCOPIC PROCEDURE TODAY AT THE [facility]: Refer to the procedure report that was given to you for any specific questions about what was found during the examination.  If the procedure report does not answer your questions, please call your gastroenterologist to clarify.  If you requested that your care partner not be given the details of your procedure findings, then the procedure report has been included in a sealed envelope for you to review at your convenience later.  YOU SHOULD EXPECT: Some feelings of bloating in the abdomen. Passage of more gas than usual.  Walking can help get rid of the air that was put into your GI tract during the procedure and reduce the bloating. If you had a lower endoscopy (such as a colonoscopy or flexible sigmoidoscopy) you may notice spotting of blood in your stool or on the toilet paper. If you underwent a bowel prep for your procedure, then you may not have a normal bowel movement for a few days.  DIET: Your first meal following the procedure should be a light meal and then it is ok to progress to your normal diet.  A half-sandwich or bowl of soup is an example of a good first meal.  Heavy or fried foods are harder to digest and may make you feel nauseous or bloated.  Likewise meals heavy in dairy and vegetables can cause extra gas to form and this can also increase the bloating.  Drink plenty of fluids but you should avoid alcoholic beverages for 24 hours.  ACTIVITY: Your care partner should take you home directly after the procedure.  You should plan to take it easy, moving slowly for the rest of the day.  You can resume normal activity the day after the procedure however you should NOT DRIVE or use heavy machinery for 24 hours (because of the sedation medicines used during the test).    SYMPTOMS TO REPORT IMMEDIATELY: A gastroenterologist can be reached at any hour.  During normal business hours, 8:30 AM to 5:00 PM Monday through Friday,  call [phone].  After hours and on weekends, please call the GI answering service at [phone] who will take a message and have the physician on call contact you.   Following lower endoscopy (colonoscopy or flexible sigmoidoscopy):  Excessive amounts of blood in the stool  Significant tenderness or worsening of abdominal pains  Swelling of the abdomen that is new, acute  Fever of 100F or higher  FOLLOW UP: If any biopsies were taken you will be contacted by phone or by letter within the next 1-3 weeks.  Call your gastroenterologist if you have not heard about the biopsies in 3 weeks.  Our staff will call the home number listed on your records the next business day following your procedure to check on you and address any questions or concerns that you may have at that time regarding the information given to you following your procedure. This is a courtesy call and so if there is no answer at the home number and we have not heard from you through the emergency physician on call, we will assume that you have returned to your regular daily activities without incident.  SIGNATURES/CONFIDENTIALITY: You and/or your care partner have signed paperwork which will be entered into your electronic medical record.  These signatures attest to the fact that that the information above on your After Visit Summary has been reviewed and is understood.  Full responsibility of the confidentiality of this   discharge information lies with you and/or your care-partner.  Polyp, diverticulosis,high fiber diet information given. 

## 2012-10-15 NOTE — Progress Notes (Signed)
Lidocaine-40mg IV prior to Propofol InductionPropofol given over incremental dosages 

## 2012-10-15 NOTE — Op Note (Signed)
[facility] [address] [place], [address]   COLONOSCOPY PROCEDURE REPORT  PATIENT: [name], Patrick  MR#: [id number] BIRTHDATE: 08-08-1956 , 56  yrs. old GENDER: Male ENDOSCOPIST: Roxy Cedar, MD REFERRED ON:GEXB Timothy Lasso, M.D. PROCEDURE DATE:  10/15/2012 PROCEDURE:   Colonoscopy with snare polypectomy    x 3 ASA CLASS:   Class II INDICATIONS:average risk screening. MEDICATIONS: MAC sedation, administered by CRNA and propofol (Diprivan) 250mg  IV  DESCRIPTION OF PROCEDURE:   After the risks benefits and alternatives of the procedure were thoroughly explained, informed consent was obtained.  A digital rectal exam revealed no abnormalities of the rectum.   The LB CF-[id number]  endoscope was introduced through the anus and advanced to the cecum, which was identified by both the appendix and ileocecal valve. No adverse events experienced.   The quality of the prep was excellent, using MoviPrep  The instrument was then slowly withdrawn as the colon was fully examined.      COLON FINDINGS: Three sessile polyps ranging between 5-49mm in size were found in the ascending (2) and descending colon.  A polypectomy was performed with a cold snare.  The resection was complete and the polyp tissue was completely retrieved.   Moderate diverticulosis was noted in the descending colon and sigmoid colon. The colon mucosa was otherwise normal.  Retroflexed views revealed internal hemorrhoids. The time to cecum=1 minutes 30 seconds.  Withdrawal time=13 minutes 30 seconds.  The scope was withdrawn and the procedure completed. COMPLICATIONS: There were no complications.  ENDOSCOPIC IMPRESSION: 1.   Three sessile polyps ranging between 5-49mm in size were found in the ascending colon and descending colon; polypectomy was performed with a cold snare 2.    Moderate diverticulosis was noted in the descending colon and sigmoid colon 3.    The colon mucosa was otherwise  normal  RECOMMENDATIONS: 1. Follow up colonoscopy in 3 or 5 years (pending path)   eSigned:  Roxy Cedar, MD 10/15/2012 10:25 AM  cc: The Patient and Creola Corn, MD   PATIENT NAME:  Patrick, [name] MR#: [id number]

## 2012-10-15 NOTE — Progress Notes (Signed)
Patient did not experience any of the following events: a burn prior to discharge; a fall within the facility; wrong site/side/patient/procedure/implant event; or a hospital transfer or hospital admission upon discharge from the facility. (G8907) Patient did not have preoperative order for IV antibiotic SSI prophylaxis. (G8918)  

## 2012-10-16 ENCOUNTER — Telehealth: Payer: Self-pay | Admitting: *Deleted

## 2012-10-16 NOTE — Telephone Encounter (Signed)
  Follow up Call-  Call back number 10/15/2012  Post procedure Call Back phone  # [phone]  Permission to leave phone message Yes     Patient questions:  Do you have a fever, pain , or abdominal swelling? no Pain Score  0 *  Have you tolerated food without any problems? yes  Have you been able to return to your normal activities? yes  Do you have any questions about your discharge instructions: Diet   no Medications  no Follow up visit  no  Do you have questions or concerns about your Care? no  Actions: * If pain score is 4 or above: No action needed, pain <4.

## 2012-10-20 ENCOUNTER — Encounter: Payer: Self-pay | Admitting: Internal Medicine

## 2013-01-25 NOTE — Progress Notes (Signed)
Pt aware of where to come-take am meds-bring ins card,id,meds

## 2013-01-26 ENCOUNTER — Encounter (HOSPITAL_BASED_OUTPATIENT_CLINIC_OR_DEPARTMENT_OTHER)
Admission: RE | Disposition: A | Discharge status: Home / Self Care | Payer: Self-pay | Source: Ambulatory Visit | Attending: Plastic Surgery

## 2013-01-26 ENCOUNTER — Ambulatory Visit (HOSPITAL_BASED_OUTPATIENT_CLINIC_OR_DEPARTMENT_OTHER)
Admission: RE | Admit: 2013-01-26 | Discharge: 2013-01-26 | Disposition: A | Discharge status: Home / Self Care | Payer: BC Managed Care – PPO | Source: Ambulatory Visit | Attending: Plastic Surgery | Admitting: Plastic Surgery

## 2013-01-26 DIAGNOSIS — Z538 Procedure and treatment not carried out for other reasons: Secondary | ICD-10-CM | POA: Insufficient documentation

## 2013-01-26 DIAGNOSIS — C4401 Basal cell carcinoma of skin of lip: Secondary | ICD-10-CM | POA: Insufficient documentation

## 2013-01-26 SURGERY — CANCELLED PROCEDURE

## 2013-01-26 SURGICAL SUPPLY — 45 items
APL SKNCLS STERI-STRIP NONHPOA (GAUZE/BANDAGES/DRESSINGS)
BANDAGE ADHESIVE 1X3 (GAUZE/BANDAGES/DRESSINGS) IMPLANT
BENZOIN TINCTURE PRP APPL 2/3 (GAUZE/BANDAGES/DRESSINGS) IMPLANT
BLADE HEX COATED 2.75 (ELECTRODE) IMPLANT
BLADE SURG 15 STRL LF DISP TIS (BLADE) ×1 IMPLANT
BLADE SURG 15 STRL SS (BLADE)
BLADE SURG ROTATE 9660 (MISCELLANEOUS) IMPLANT
CAUTERY EYE LOW TEMP 1300F FIN (OPHTHALMIC RELATED) IMPLANT
CONT SPEC 4OZ CLIKSEAL STRL BL (MISCELLANEOUS) IMPLANT
ELECT NDL TIP 2.8 STRL (NEEDLE) IMPLANT
ELECT NEEDLE TIP 2.8 STRL (NEEDLE) IMPLANT
GAUZE SPONGE 4X4 12PLY STRL LF (GAUZE/BANDAGES/DRESSINGS) IMPLANT
GLOVE BIO SURGEON STRL SZ 6.5 (GLOVE) ×1 IMPLANT
MARKER SKIN DUAL TIP RULER LAB (MISCELLANEOUS) ×1 IMPLANT
NDL HYPO 25X1 1.5 SAFETY (NEEDLE) IMPLANT
NDL HYPO 30X.5 LL (NEEDLE) ×1 IMPLANT
NDL SAFETY ECLIPSE 18X1.5 (NEEDLE) IMPLANT
NEEDLE 27GAX1X1/2 (NEEDLE) IMPLANT
NEEDLE HYPO 18GX1.5 SHARP (NEEDLE)
NEEDLE HYPO 25X1 1.5 SAFETY (NEEDLE) IMPLANT
NEEDLE HYPO 30X.5 LL (NEEDLE) IMPLANT
NS IRRIG 1000ML POUR BTL (IV SOLUTION) IMPLANT
PAD ALCOHOL SWAB (MISCELLANEOUS) ×1 IMPLANT
PENCIL BUTTON HOLSTER BLD 10FT (ELECTRODE) IMPLANT
PUNCH BIOPSY DERMAL 2MM (MISCELLANEOUS) IMPLANT
PUNCH BIOPSY DERMAL 3MM (MISCELLANEOUS) IMPLANT
PUNCH BIOPSY DERMAL 4MM (MISCELLANEOUS) IMPLANT
PUNCH BIOPSY DERMAL 5MM STRL (MISCELLANEOUS) IMPLANT
SPONGE GAUZE 4X4 12PLY (GAUZE/BANDAGES/DRESSINGS) IMPLANT
STRIP CLOSURE SKIN 1/2X4 (GAUZE/BANDAGES/DRESSINGS) IMPLANT
STRIP CLOSURE SKIN 1/4X4 (GAUZE/BANDAGES/DRESSINGS) IMPLANT
STRIP SUTURE WOUND CLOSURE 1/2 (SUTURE) IMPLANT
SUT MNCRL AB 3-0 PS2 18 (SUTURE) IMPLANT
SUT MNCRL AB 4-0 PS2 18 (SUTURE) IMPLANT
SUT MON AB 2-0 CT1 36 (SUTURE) IMPLANT
SUT MON AB 5-0 P3 18 (SUTURE) IMPLANT
SUT PROLENE 5 0 PS 2 (SUTURE) IMPLANT
SUT PROLENE 6 0 P 1 18 (SUTURE) IMPLANT
SUT SILK 6 0 P 1 (SUTURE) IMPLANT
SUT VIC AB 5-0 P-3 18X BRD (SUTURE) IMPLANT
SUT VIC AB 5-0 P3 18 (SUTURE)
SWABSTICK POVIDONE IODINE SNGL (MISCELLANEOUS) ×2 IMPLANT
SYR 3ML 23GX1 SAFETY (SYRINGE) IMPLANT
SYR CONTROL 10ML LL (SYRINGE) ×1 IMPLANT
TOWEL OR 17X24 6PK STRL BLUE (TOWEL DISPOSABLE) IMPLANT

## 2013-01-27 NOTE — Progress Notes (Signed)
Pt r/s from 6/17-dr running late-pt will r/s

## 2013-01-28 ENCOUNTER — Encounter (HOSPITAL_BASED_OUTPATIENT_CLINIC_OR_DEPARTMENT_OTHER)
Admission: RE | Disposition: A | Discharge status: Home / Self Care | Payer: Self-pay | Source: Ambulatory Visit | Attending: Plastic Surgery

## 2013-01-28 ENCOUNTER — Ambulatory Visit (HOSPITAL_BASED_OUTPATIENT_CLINIC_OR_DEPARTMENT_OTHER)
Admission: RE | Admit: 2013-01-28 | Discharge: 2013-01-28 | Disposition: A | Discharge status: Home / Self Care | Payer: BC Managed Care – PPO | Source: Ambulatory Visit | Attending: Plastic Surgery | Admitting: Plastic Surgery

## 2013-01-28 ENCOUNTER — Encounter (HOSPITAL_BASED_OUTPATIENT_CLINIC_OR_DEPARTMENT_OTHER): Payer: Self-pay

## 2013-01-28 DIAGNOSIS — C44319 Basal cell carcinoma of skin of other parts of face: Secondary | ICD-10-CM | POA: Insufficient documentation

## 2013-01-28 DIAGNOSIS — L578 Other skin changes due to chronic exposure to nonionizing radiation: Secondary | ICD-10-CM | POA: Insufficient documentation

## 2013-01-28 DIAGNOSIS — C4401 Basal cell carcinoma of skin of lip: Secondary | ICD-10-CM | POA: Insufficient documentation

## 2013-01-28 DIAGNOSIS — R972 Elevated prostate specific antigen [PSA]: Secondary | ICD-10-CM | POA: Insufficient documentation

## 2013-01-28 HISTORY — PX: LESION REMOVAL: SHX5196

## 2013-01-28 SURGERY — MINOR EXCISION OF LESION
Anesthesia: LOCAL | Site: Face | Laterality: Right | Wound class: Clean

## 2013-01-28 MED ORDER — LIDOCAINE-EPINEPHRINE (PF) 1 %-1:200000 IJ SOLN
INTRAMUSCULAR | Status: DC | PRN
  Administered 2013-01-28: 10.5 mL

## 2013-01-28 MED ORDER — BACITRACIN ZINC 500 UNIT/GM EX OINT
TOPICAL_OINTMENT | CUTANEOUS | Status: DC | PRN
  Administered 2013-01-28: 1 via TOPICAL

## 2013-01-28 SURGICAL SUPPLY — 49 items
APL SKNCLS STERI-STRIP NONHPOA (GAUZE/BANDAGES/DRESSINGS)
BANDAGE ADHESIVE 1X3 (GAUZE/BANDAGES/DRESSINGS) IMPLANT
BENZOIN TINCTURE PRP APPL 2/3 (GAUZE/BANDAGES/DRESSINGS) IMPLANT
BLADE HEX COATED 2.75 (ELECTRODE) IMPLANT
BLADE SURG 15 STRL LF DISP TIS (BLADE) ×1 IMPLANT
BLADE SURG 15 STRL SS (BLADE) ×2
BLADE SURG ROTATE 9660 (MISCELLANEOUS) IMPLANT
CAUTERY EYE LOW TEMP 1300F FIN (OPHTHALMIC RELATED) IMPLANT
CONT SPEC 4OZ CLIKSEAL STRL BL (MISCELLANEOUS) IMPLANT
ELECT NDL TIP 2.8 STRL (NEEDLE) IMPLANT
ELECT NEEDLE TIP 2.8 STRL (NEEDLE) ×2 IMPLANT
ELECT REM PT RETURN 9FT ADLT (ELECTROSURGICAL) ×2
ELECTRODE REM PT RTRN 9FT ADLT (ELECTROSURGICAL) IMPLANT
GAUZE SPONGE 4X4 12PLY STRL LF (GAUZE/BANDAGES/DRESSINGS) IMPLANT
GLOVE BIO SURGEON STRL SZ 6.5 (GLOVE) ×4 IMPLANT
GLOVE BIOGEL M STRL SZ7.5 (GLOVE) ×3 IMPLANT
MARKER SKIN DUAL TIP RULER LAB (MISCELLANEOUS) ×2 IMPLANT
NDL HYPO 25X1 1.5 SAFETY (NEEDLE) IMPLANT
NDL HYPO 30X.5 LL (NEEDLE) ×1 IMPLANT
NDL SAFETY ECLIPSE 18X1.5 (NEEDLE) IMPLANT
NEEDLE 27GAX1X1/2 (NEEDLE) IMPLANT
NEEDLE HYPO 18GX1.5 SHARP (NEEDLE)
NEEDLE HYPO 25X1 1.5 SAFETY (NEEDLE) ×4 IMPLANT
NEEDLE HYPO 30X.5 LL (NEEDLE) IMPLANT
NS IRRIG 1000ML POUR BTL (IV SOLUTION) IMPLANT
PAD ALCOHOL SWAB (MISCELLANEOUS) ×2 IMPLANT
PENCIL BUTTON HOLSTER BLD 10FT (ELECTRODE) ×1 IMPLANT
PUNCH BIOPSY DERMAL 2MM (MISCELLANEOUS) IMPLANT
PUNCH BIOPSY DERMAL 3MM (MISCELLANEOUS) IMPLANT
PUNCH BIOPSY DERMAL 4MM (MISCELLANEOUS) IMPLANT
PUNCH BIOPSY DERMAL 5MM STRL (MISCELLANEOUS) IMPLANT
SPONGE GAUZE 4X4 12PLY (GAUZE/BANDAGES/DRESSINGS) IMPLANT
STRIP CLOSURE SKIN 1/2X4 (GAUZE/BANDAGES/DRESSINGS) IMPLANT
STRIP CLOSURE SKIN 1/4X4 (GAUZE/BANDAGES/DRESSINGS) IMPLANT
STRIP SUTURE WOUND CLOSURE 1/2 (SUTURE) IMPLANT
SUT MNCRL AB 3-0 PS2 18 (SUTURE) IMPLANT
SUT MNCRL AB 4-0 PS2 18 (SUTURE) ×2 IMPLANT
SUT MON AB 2-0 CT1 36 (SUTURE) IMPLANT
SUT MON AB 5-0 P3 18 (SUTURE) IMPLANT
SUT PROLENE 5 0 PS 2 (SUTURE) IMPLANT
SUT PROLENE 6 0 P 1 18 (SUTURE) ×1 IMPLANT
SUT SILK 6 0 P 1 (SUTURE) ×1 IMPLANT
SUT VIC AB 5-0 P-3 18X BRD (SUTURE) IMPLANT
SUT VIC AB 5-0 P3 18 (SUTURE)
SWABSTICK POVIDONE IODINE SNGL (MISCELLANEOUS) ×2 IMPLANT
SYR 3ML 23GX1 SAFETY (SYRINGE) IMPLANT
SYR CONTROL 10ML LL (SYRINGE) ×3 IMPLANT
TOWEL OR 17X24 6PK STRL BLUE (TOWEL DISPOSABLE) IMPLANT
TRAY DSU PREP LF (CUSTOM PROCEDURE TRAY) ×1 IMPLANT

## 2013-01-28 NOTE — H&P (Signed)
  H&P faxed to surgical center.  -History and Physical Reviewed  -Patient has been re-examined  -No change in the plan of care  Patrick [name]    

## 2013-01-28 NOTE — Brief Op Note (Signed)
01/28/2013  9:44 AM  PATIENT:  Patrick [name]  57 y.o. male  PRE-OPERATIVE DIAGNOSIS:  BASAL CELL CARCINOMA  POST-OPERATIVE DIAGNOSIS:  BASAL CELL CARCINOMA  PROCEDURE:  Procedure(s): BASAL CELL CARCINOMA REMOVAL RIGHT LOWER LIP AND RIGHT UPPER CHEEK WITH FROZEN SECTION X2 (Right)  SURGEON:  Surgeon(s) and Role:    * Karie Fetch, MD - Primary  ANESTHESIA:   local  LOCAL MEDICATIONS USED:  LIDOCAINE   SPECIMEN:  Source of Specimen:  Right Upper lateral Cheek and Right Marionette Line  DISPOSITION OF SPECIMEN:  PATHOLOGY  COUNTS:  YES  DICTATION: .Note written in paper chart and Other Dictation: Dictation Number [id number]  PLAN OF CARE: Discharge to home after PACU  PATIENT DISPOSITION:  PACU - hemodynamically stable.   Delay start of Pharmacological VTE agent (>24hrs) due to surgical blood loss or risk of bleeding: not applicable

## 2013-02-01 ENCOUNTER — Encounter (HOSPITAL_BASED_OUTPATIENT_CLINIC_OR_DEPARTMENT_OTHER): Payer: Self-pay | Admitting: Plastic Surgery

## 2013-03-12 NOTE — Op Note (Signed)
Patrick [name], Patrick [name] NO.:  [id number]  MEDICAL RECORD NO.:  [id number]  LOCATION:                               FACILITY:  [facility]  PHYSICIAN:  Brantley Persons, M.D.DATE OF BIRTH:  1956/07/14  DATE OF PROCEDURE:  01/28/2013 DATE OF DISCHARGE:  01/28/2013                              OPERATIVE REPORT   PREOPERATIVE DIAGNOSES: 1. Basal cell cancer, right cheek. 2. Basal cell cancer, right lower lip.  POSTOPERATIVE DIAGNOSES: 1. Basal cell cancer, right cheek. 2. Basal cell cancer, right lower lip.  PROCEDURE: 1. Wide local excision, 2.0-cm right lower lip, basal cell cancer with     intraoperative frozen section diagnosis. 2. Wide local excision, 1.6-cm right upper lateral cheek basal cell     cancer with intraoperative frozen section. 3. Complex closure, 3.0-cm right lower lip incision. 4. Complex closure 2.4-cm right upper cheek skin incision.  ATTENDING SURGEON:  Brantley Persons, MD  ANESTHESIA:  1% lidocaine with epinephrine.  COMPLICATIONS:  None.  INDICATIONS FOR THE PROCEDURE:  The patient presents with a biopsy- proven basal cell cancers of the right upper lateral cheek as well as the right lower lip.  He would like to have these reexcised with clear surgical margins.  We will proceed to do so with intraoperative frozen section diagnoses to help US obtain clear surgical excision margins.  PROCEDURE IN DETAIL:  The patient was brought into the OR and laid supine on the OR table.  The face was cleansed with Betadine and draped in sterile fashion.  The skin and subcutaneous tissues in the area of the right upper lateral cheek skin lesion as well as just below the right lower lip were infiltrated with 1% lidocaine with epinephrine. After adequate hemostasis and anesthesia taken effect, the procedure was begun.  Both of the basal cell cancers were excised in the following manner. Using loupe magnification, the right lower lip basal cell cancer  was identified.  I marked 1-2 mm circumferential margins and excised the skin lesion full-thickness into the subcutaneous tissues.  This specimen was marked at 12 o'clock position and passed off the table to undergo intraoperative frozen section diagnosis.  After the pathologist reviewed the specimen, he indicated that the cancer had been removed, with  clear margins.  I then next proceeded with the wide local excision of the right upper lateral cheek basal cell cancer.  Again using loupe magnification, the skin lesion was identified.  1-2 mm margins were marked circumferentially and the lesion was excised full-thickness into the subcutaneous tissues.  The specimen was marked at 12 o'clock position to undergo intraoperative frozen section diagnosis.  After the pathologist looked at the specimen, he determined that there was still some basal cell cancer left in one of the margins, so the margins were reexcised and appropriately marked to indicate the external margin from the internal margin of the specimen.  A second specimen was sent to the pathologist and after the pathologist read this specimen, he reported that all of the basal cell cancer had been removed and the margins were clear.  I then proceeded to close both the skin lesions in complex fashion.  The  skin and subcutaneous tissues were widely undermined along both areas of skin lesion excision.  First, the right upper lateral cheek incision was closed.  The deeper subcutaneous tissues in the dermal layer were closed with 3-0 Monocryl suture.  A superficial dermal layer was closed with 4-0 Monocryl suture.  The skin was then closed with a 6-0 Prolene in a running baseball-type stitch.  The incision below the right lower lip was then closed.  This was also closed in a complex fashion.  The deeper subcutaneous tissues and the dermal layer were closed with 3-0 Monocryl suture.  The superficial dermal layer was closed with 4-0 Monocryl  suture.  The skin was then closed with a 6-0 Prolene in a running baseball-type stitch.  Bacitracin was applied to both incisions for dressing.  There are no complications.  The patient tolerated procedure well.  The patient was then recovered from the local anesthesia without any difficulty.  She was given proper postoperative wound care instructions which include cleaning the incisions with a Q-tip and water 3-4 times daily and applying bacitracin ointment.  He was then discharged home in stable condition.  Follow up appointment will be in the office tomorrow. Total length of specimen excision for the right lower lip were 2.0 cm and then a complex closure was 3.0 cm.  Total length of excision for the right upper lateral cheek basal cell cancer was 1.6 cm and total length of complex closure of the incision was 2.4 cm.          ______________________________ Brantley Persons, M.D.     MC/MEDQ  D:  03/11/2013  T:  03/11/2013  Job:  [id number]

## 2013-08-23 ENCOUNTER — Other Ambulatory Visit: Payer: Self-pay | Admitting: Internal Medicine

## 2013-08-23 DIAGNOSIS — R519 Headache, unspecified: Secondary | ICD-10-CM

## 2013-08-23 DIAGNOSIS — R51 Headache: Principal | ICD-10-CM

## 2013-08-24 ENCOUNTER — Other Ambulatory Visit: Payer: BC Managed Care – PPO

## 2013-12-09 ENCOUNTER — Other Ambulatory Visit (HOSPITAL_COMMUNITY): Payer: Self-pay | Admitting: Urology

## 2013-12-09 DIAGNOSIS — R972 Elevated prostate specific antigen [PSA]: Secondary | ICD-10-CM

## 2013-12-17 ENCOUNTER — Ambulatory Visit (HOSPITAL_COMMUNITY)
Admission: RE | Admit: 2013-12-17 | Discharge: 2013-12-17 | Disposition: A | Discharge status: Home / Self Care | Payer: BC Managed Care – PPO | Source: Ambulatory Visit | Attending: Urology | Admitting: Urology

## 2013-12-22 ENCOUNTER — Other Ambulatory Visit (HOSPITAL_COMMUNITY): Payer: Self-pay | Admitting: Urology

## 2013-12-22 DIAGNOSIS — R972 Elevated prostate specific antigen [PSA]: Secondary | ICD-10-CM

## 2013-12-28 ENCOUNTER — Ambulatory Visit (HOSPITAL_COMMUNITY): Payer: BC Managed Care – PPO

## 2014-01-12 ENCOUNTER — Ambulatory Visit (HOSPITAL_COMMUNITY)
Admission: RE | Admit: 2014-01-12 | Discharge: 2014-01-12 | Disposition: A | Discharge status: Home / Self Care | Payer: BC Managed Care – PPO | Source: Ambulatory Visit | Attending: Urology | Admitting: Urology

## 2014-01-12 DIAGNOSIS — N400 Enlarged prostate without lower urinary tract symptoms: Secondary | ICD-10-CM | POA: Insufficient documentation

## 2014-01-12 DIAGNOSIS — R972 Elevated prostate specific antigen [PSA]: Secondary | ICD-10-CM

## 2014-01-12 MED ORDER — GADOBENATE DIMEGLUMINE 529 MG/ML IV SOLN
20.0000 mL | Freq: Once | INTRAVENOUS | Status: AC
  Administered 2014-01-12: 19 mL via INTRAVENOUS

## 2014-08-25 ENCOUNTER — Encounter (HOSPITAL_BASED_OUTPATIENT_CLINIC_OR_DEPARTMENT_OTHER): Payer: Self-pay | Admitting: Plastic Surgery

## 2014-11-11 DIAGNOSIS — S83519A Sprain of anterior cruciate ligament of unspecified knee, initial encounter: Secondary | ICD-10-CM

## 2014-11-11 HISTORY — DX: Sprain of anterior cruciate ligament of unspecified knee, initial encounter: S83.519A

## 2014-11-28 ENCOUNTER — Other Ambulatory Visit: Payer: Self-pay | Admitting: Physician Assistant

## 2014-12-05 ENCOUNTER — Encounter (HOSPITAL_BASED_OUTPATIENT_CLINIC_OR_DEPARTMENT_OTHER): Payer: Self-pay | Admitting: *Deleted

## 2014-12-06 ENCOUNTER — Encounter (HOSPITAL_BASED_OUTPATIENT_CLINIC_OR_DEPARTMENT_OTHER): Payer: Self-pay | Admitting: *Deleted

## 2014-12-07 NOTE — H&P (Signed)
  [facility] [address] [place], [place] [address] [phone] A Division of [facility]  Ninetta Lights, M.D.   Robert A. Noemi Chapel, M.D.   Faythe Casa, M.D.   Johnny Bridge, M.D.   Almedia Balls, M.D Ernesta Amble. Percell Miller, M.D.  Joseph Pierini, M.D.  Lanier Prude, M.D.    Verner Chol, M.D. Lovett Calender, PA- C  Mary L. Venida Jarvis, PA-C  Kirstin A. Shepperson, PA-C  Josh Baxter, PA-C  [place], [place]   RE: Patrick [name], Patrick [name]                                [phone]      DOB: [birth year] PROGRESS NOTE: 11-01-14 Thaddaeus comes in with a new injury to his right knee.  Running, abrupt turn to the right and he felt something pull and tear in his knee.  Describes a pivot shift type phenomenon.  Not as much swelling as he has had when he first tore his ACL.  He is alright as long as he goes straight ahead, but he is unstable with any pivoting or rapid turning.  He comes in for evaluation and treatment recommendations.  Of note, this is the same knee I did an arthroscopy and ACL reconstruction utilizing patellar tendon allograft.  Also debrided lateral meniscus.  That was on January 29, 2007.  Fully recovered, rehabbed and we have not dealt with this knee at all over the years.   History, workup and treatment to date reviewed.   EXAMINATION: General exam is outlined and included in the chart.  Specifically, very fit 59 year-old male.  Right knee definitely has an increased excursion with Lachman and drawer.  I am not getting a firm end point and it definitely feels different than the opposite knee.  He is point tender medial joint line.  Has pain with McMurray's.  Other ligaments stable.  Only a 1+ effusion.  Neurovascularly intact distally.    X-RAYS: Four view standing x-ray reveals that despite his previous intervention there are minimal to no degenerative changes or narrowing.    IMPRESSION: Recurrent tear ACL.   Possible medial meniscus tear.  PLAN: He is going to get an MRI and call me when that is complete.  We have discussed definitive treatment.  Final decision depending on what it shows.  If it comes down to repeating reconstruction it would be with an anterior tib allograft.  All of that thoroughly discussed and he understands.  Paperwork complete, but again pending his MRI.    Ninetta Lights, M.D.   Electronically verified by Ninetta Lights, M.D. DFM:jjh D 11-02-14 T 11-03-14

## 2014-12-08 ENCOUNTER — Ambulatory Visit (HOSPITAL_BASED_OUTPATIENT_CLINIC_OR_DEPARTMENT_OTHER)
Admission: RE | Admit: 2014-12-08 | Discharge: 2014-12-08 | Disposition: A | Discharge status: Home / Self Care | Payer: BLUE CROSS/BLUE SHIELD | Source: Ambulatory Visit | Attending: Orthopedic Surgery | Admitting: Orthopedic Surgery

## 2014-12-08 ENCOUNTER — Ambulatory Visit (HOSPITAL_BASED_OUTPATIENT_CLINIC_OR_DEPARTMENT_OTHER): Payer: BLUE CROSS/BLUE SHIELD | Admitting: Anesthesiology

## 2014-12-08 ENCOUNTER — Encounter (HOSPITAL_BASED_OUTPATIENT_CLINIC_OR_DEPARTMENT_OTHER)
Admission: RE | Disposition: A | Discharge status: Home / Self Care | Payer: Self-pay | Source: Ambulatory Visit | Attending: Orthopedic Surgery

## 2014-12-08 ENCOUNTER — Encounter (HOSPITAL_BASED_OUTPATIENT_CLINIC_OR_DEPARTMENT_OTHER): Payer: Self-pay | Admitting: Anesthesiology

## 2014-12-08 DIAGNOSIS — M2341 Loose body in knee, right knee: Secondary | ICD-10-CM | POA: Insufficient documentation

## 2014-12-08 DIAGNOSIS — Y929 Unspecified place or not applicable: Secondary | ICD-10-CM | POA: Insufficient documentation

## 2014-12-08 DIAGNOSIS — M94261 Chondromalacia, right knee: Secondary | ICD-10-CM | POA: Insufficient documentation

## 2014-12-08 DIAGNOSIS — S83281A Other tear of lateral meniscus, current injury, right knee, initial encounter: Secondary | ICD-10-CM | POA: Insufficient documentation

## 2014-12-08 DIAGNOSIS — Z9889 Other specified postprocedural states: Secondary | ICD-10-CM

## 2014-12-08 DIAGNOSIS — S83511A Sprain of anterior cruciate ligament of right knee, initial encounter: Secondary | ICD-10-CM | POA: Diagnosis present

## 2014-12-08 HISTORY — PX: KNEE ARTHROSCOPY WITH DRILLING/MICROFRACTURE: SHX6425

## 2014-12-08 HISTORY — DX: Other chronic pain: G89.29

## 2014-12-08 HISTORY — DX: Unspecified osteoarthritis, unspecified site: M19.90

## 2014-12-08 HISTORY — PX: ARTHROSCOPY WITH ANTERIOR CRUCIATE LIGAMENT (ACL) REPAIR WITH ANTERIOR TIBILIAS GRAFT: SHX6503

## 2014-12-08 HISTORY — PX: KNEE ARTHROSCOPY WITH LATERAL MENISECTOMY: SHX6193

## 2014-12-08 HISTORY — DX: Low back pain, unspecified: M54.50

## 2014-12-08 HISTORY — DX: Low back pain: M54.5

## 2014-12-08 HISTORY — DX: Other seasonal allergic rhinitis: J30.2

## 2014-12-08 HISTORY — DX: Dental restoration status: Z98.811

## 2014-12-08 HISTORY — PX: CHONDROPLASTY: SHX5177

## 2014-12-08 HISTORY — DX: Sprain of anterior cruciate ligament of unspecified knee, initial encounter: S83.519A

## 2014-12-08 LAB — POCT HEMOGLOBIN-HEMACUE: Hemoglobin: 14.9 g/dL (ref 13.0–17.0)

## 2014-12-08 SURGERY — KNEE ARTHROSCOPY WITH ANTERIOR CRUCIATE LIGAMENT (ACL) REPAIR WITH ANTERIOR TIBILIAS GRAFT
Anesthesia: General | Site: Knee | Laterality: Right

## 2014-12-08 MED ORDER — LACTATED RINGERS IV SOLN
INTRAVENOUS | Status: DC
  Administered 2014-12-08 (×2): via INTRAVENOUS

## 2014-12-08 MED ORDER — MIDAZOLAM HCL 2 MG/ML PO SYRP: 12.0000 mg | ORAL_SOLUTION | Freq: Once | ORAL | Status: DC | PRN

## 2014-12-08 MED ORDER — GLYCOPYRROLATE 0.2 MG/ML IJ SOLN: 0.2000 mg | Freq: Once | INTRAMUSCULAR | Status: DC | PRN

## 2014-12-08 MED ORDER — FENTANYL CITRATE (PF) 100 MCG/2ML IJ SOLN
50.0000 ug | INTRAMUSCULAR | Status: DC | PRN
  Administered 2014-12-08: 100 ug via INTRAVENOUS

## 2014-12-08 MED ORDER — FENTANYL CITRATE (PF) 100 MCG/2ML IJ SOLN
INTRAMUSCULAR | Status: DC | PRN
  Administered 2014-12-08: 25 ug via INTRAVENOUS

## 2014-12-08 MED ORDER — OXYCODONE HCL 5 MG/5ML PO SOLN: 5.0000 mg | Freq: Once | ORAL | Status: AC | PRN

## 2014-12-08 MED ORDER — METHOCARBAMOL 1000 MG/10ML IJ SOLN: 500.0000 mg | Freq: Four times a day (QID) | INTRAVENOUS | Status: DC | PRN

## 2014-12-08 MED ORDER — MIDAZOLAM HCL 2 MG/2ML IJ SOLN
1.0000 mg | INTRAMUSCULAR | Status: DC | PRN
  Administered 2014-12-08 (×2): 2 mg via INTRAVENOUS

## 2014-12-08 MED ORDER — KETOROLAC TROMETHAMINE 30 MG/ML IJ SOLN: 30.0000 mg | Freq: Once | INTRAMUSCULAR | Status: DC | PRN

## 2014-12-08 MED ORDER — FENTANYL CITRATE (PF) 100 MCG/2ML IJ SOLN
INTRAMUSCULAR | Status: AC
  Filled 2014-12-08: qty 2

## 2014-12-08 MED ORDER — OXYCODONE HCL 5 MG PO TABS
5.0000 mg | ORAL_TABLET | Freq: Once | ORAL | Status: AC | PRN
  Administered 2014-12-08: 5 mg via ORAL

## 2014-12-08 MED ORDER — IBUPROFEN 100 MG/5ML PO SUSP: 200.0000 mg | Freq: Four times a day (QID) | ORAL | Status: DC | PRN

## 2014-12-08 MED ORDER — IBUPROFEN 200 MG PO TABS: 200.0000 mg | ORAL_TABLET | Freq: Four times a day (QID) | ORAL | Status: DC | PRN

## 2014-12-08 MED ORDER — ONDANSETRON HCL 4 MG/2ML IJ SOLN
INTRAMUSCULAR | Status: DC | PRN
  Administered 2014-12-08: 4 mg via INTRAVENOUS

## 2014-12-08 MED ORDER — OXYCODONE-ACETAMINOPHEN 5-325 MG PO TABS: 1.0000 | ORAL_TABLET | ORAL | Status: DC | PRN

## 2014-12-08 MED ORDER — ONDANSETRON HCL 4 MG PO TABS
4.0000 mg | ORAL_TABLET | Freq: Four times a day (QID) | ORAL | Status: DC | PRN
Start: 2014-12-08 — End: 2014-12-08

## 2014-12-08 MED ORDER — VANCOMYCIN HCL IN DEXTROSE 1-5 GM/200ML-% IV SOLN
INTRAVENOUS | Status: AC
  Filled 2014-12-08: qty 200

## 2014-12-08 MED ORDER — VANCOMYCIN HCL IN DEXTROSE 500-5 MG/100ML-% IV SOLN
INTRAVENOUS | Status: AC
  Filled 2014-12-08: qty 100

## 2014-12-08 MED ORDER — CLINDAMYCIN PHOSPHATE 900 MG/50ML IV SOLN
INTRAVENOUS | Status: AC
  Filled 2014-12-08: qty 50

## 2014-12-08 MED ORDER — OXYCODONE HCL 5 MG PO TABS
ORAL_TABLET | ORAL | Status: AC
  Filled 2014-12-08: qty 1

## 2014-12-08 MED ORDER — VANCOMYCIN HCL 1000 MG IV SOLR
1000.0000 mg | INTRAVENOUS | Status: DC | PRN
  Administered 2014-12-08: 1000 mg via INTRAVENOUS

## 2014-12-08 MED ORDER — METOCLOPRAMIDE HCL 5 MG/ML IJ SOLN: 5.0000 mg | Freq: Three times a day (TID) | INTRAMUSCULAR | Status: DC | PRN

## 2014-12-08 MED ORDER — MIDAZOLAM HCL 2 MG/2ML IJ SOLN
INTRAMUSCULAR | Status: AC
  Filled 2014-12-08: qty 2

## 2014-12-08 MED ORDER — POTASSIUM CHLORIDE IN NACL 20-0.9 MEQ/L-% IV SOLN: INTRAVENOUS | Status: DC

## 2014-12-08 MED ORDER — SODIUM CHLORIDE 0.9 % IR SOLN
Status: DC | PRN
  Administered 2014-12-08: 9000 mL

## 2014-12-08 MED ORDER — PROPOFOL 10 MG/ML IV BOLUS
INTRAVENOUS | Status: DC | PRN
  Administered 2014-12-08: 200 mg via INTRAVENOUS

## 2014-12-08 MED ORDER — LACTATED RINGERS IV SOLN: INTRAVENOUS | Status: DC

## 2014-12-08 MED ORDER — ONDANSETRON HCL 4 MG/2ML IJ SOLN: 4.0000 mg | Freq: Four times a day (QID) | INTRAMUSCULAR | Status: DC | PRN

## 2014-12-08 MED ORDER — DEXAMETHASONE SODIUM PHOSPHATE 4 MG/ML IJ SOLN
INTRAMUSCULAR | Status: DC | PRN
  Administered 2014-12-08: 10 mg via INTRAVENOUS

## 2014-12-08 MED ORDER — CLINDAMYCIN PHOSPHATE 900 MG/50ML IV SOLN: 900.0000 mg | INTRAVENOUS | Status: DC

## 2014-12-08 MED ORDER — HYDROMORPHONE HCL 1 MG/ML IJ SOLN: 0.5000 mg | INTRAMUSCULAR | Status: DC | PRN

## 2014-12-08 MED ORDER — ONDANSETRON HCL 4 MG PO TABS: 4.0000 mg | ORAL_TABLET | Freq: Three times a day (TID) | ORAL | Status: DC | PRN

## 2014-12-08 MED ORDER — FENTANYL CITRATE (PF) 100 MCG/2ML IJ SOLN
INTRAMUSCULAR | Status: AC
  Filled 2014-12-08: qty 6

## 2014-12-08 MED ORDER — METOCLOPRAMIDE HCL 5 MG PO TABS
5.0000 mg | ORAL_TABLET | Freq: Three times a day (TID) | ORAL | Status: DC | PRN
Start: 2014-12-08 — End: 2014-12-08

## 2014-12-08 MED ORDER — MORPHINE SULFATE 10 MG/ML IJ SOLN
INTRAMUSCULAR | Status: AC
  Filled 2014-12-08: qty 1

## 2014-12-08 MED ORDER — BUPIVACAINE-EPINEPHRINE (PF) 0.25% -1:200000 IJ SOLN
INTRAMUSCULAR | Status: DC | PRN
  Administered 2014-12-08: 25 mL via PERINEURAL

## 2014-12-08 MED ORDER — METHOCARBAMOL 500 MG PO TABS: 500.0000 mg | ORAL_TABLET | Freq: Four times a day (QID) | ORAL | Status: DC | PRN

## 2014-12-08 MED ORDER — MEPERIDINE HCL 25 MG/ML IJ SOLN: 6.2500 mg | INTRAMUSCULAR | Status: DC | PRN

## 2014-12-08 MED ORDER — HYDROMORPHONE HCL 1 MG/ML IJ SOLN: 0.2500 mg | INTRAMUSCULAR | Status: DC | PRN

## 2014-12-08 MED ORDER — CHLORHEXIDINE GLUCONATE 4 % EX LIQD: 60.0000 mL | Freq: Once | CUTANEOUS | Status: DC

## 2014-12-08 SURGICAL SUPPLY — 80 items
ANCH SUT PUSHLCK 19.5X3.5 STRL (Anchor) ×1 IMPLANT
ANCHOR BUTTON TIGHTROPE BTB (Anchor) ×1 IMPLANT
ANCHOR BUTTON TIGHTROPE RN 14 (Anchor) ×1 IMPLANT
ANCHOR PUSHLOCK PEEK 3.5X19.5 (Anchor) ×1 IMPLANT
APL SKNCLS STERI-STRIP NONHPOA (GAUZE/BANDAGES/DRESSINGS) ×1
BANDAGE ELASTIC 6 VELCRO ST LF (GAUZE/BANDAGES/DRESSINGS) IMPLANT
BANDAGE ESMARK 6X9 LF (GAUZE/BANDAGES/DRESSINGS) ×1 IMPLANT
BENZOIN TINCTURE PRP APPL 2/3 (GAUZE/BANDAGES/DRESSINGS) ×2 IMPLANT
BLADE 4.2CUDA (BLADE) IMPLANT
BLADE CUDA 5.5 (BLADE) IMPLANT
BLADE CUDA GRT WHITE 3.5 (BLADE) IMPLANT
BLADE CUTTER GATOR 3.5 (BLADE) ×2 IMPLANT
BLADE CUTTER MENIS 5.5 (BLADE) IMPLANT
BLADE GREAT WHITE 4.2 (BLADE) ×2 IMPLANT
BLADE SURG 15 STRL LF DISP TIS (BLADE) ×1 IMPLANT
BLADE SURG 15 STRL SS (BLADE) ×2
BNDG CMPR 9X6 STRL LF SNTH (GAUZE/BANDAGES/DRESSINGS) ×1
BNDG ESMARK 6X9 LF (GAUZE/BANDAGES/DRESSINGS) ×2
BUR OVAL 6.0 (BURR) ×3 IMPLANT
COVER BACK TABLE 60X90IN (DRAPES) ×2 IMPLANT
CUFF TOURNIQUET SINGLE 34IN LL (TOURNIQUET CUFF) ×1 IMPLANT
CUTTER MENISCUS  4.2MM (BLADE)
CUTTER MENISCUS 4.2MM (BLADE) IMPLANT
DECANTER SPIKE VIAL GLASS SM (MISCELLANEOUS) IMPLANT
DRAPE ARTHROSCOPY W/POUCH 114 (DRAPES) ×2 IMPLANT
DRAPE OEC MINIVIEW 54X84 (DRAPES) ×2 IMPLANT
DRAPE U-SHAPE 47X51 STRL (DRAPES) ×2 IMPLANT
DURAPREP 26ML APPLICATOR (WOUND CARE) ×2 IMPLANT
ELECT MENISCUS 165MM 90D (ELECTRODE) IMPLANT
ELECT REM PT RETURN 9FT ADLT (ELECTROSURGICAL) ×2
ELECTRODE REM PT RTRN 9FT ADLT (ELECTROSURGICAL) ×1 IMPLANT
GAUZE SPONGE 4X4 12PLY STRL (GAUZE/BANDAGES/DRESSINGS) ×4 IMPLANT
GAUZE XEROFORM 1X8 LF (GAUZE/BANDAGES/DRESSINGS) ×2 IMPLANT
GLOVE BIO SURGEON STRL SZ 6.5 (GLOVE) ×1 IMPLANT
GLOVE BIOGEL PI IND STRL 7.0 (GLOVE) ×1 IMPLANT
GLOVE BIOGEL PI INDICATOR 7.0 (GLOVE) ×2
GLOVE ECLIPSE 7.0 STRL STRAW (GLOVE) ×2 IMPLANT
GLOVE EXAM NITRILE MD LF STRL (GLOVE) ×1 IMPLANT
GLOVE ORTHO TXT STRL SZ7.5 (GLOVE) ×4 IMPLANT
GLOVE SURG ORTHO 8.0 STRL STRW (GLOVE) ×2 IMPLANT
GOWN STRL REUS W/ TWL LRG LVL3 (GOWN DISPOSABLE) ×2 IMPLANT
GOWN STRL REUS W/ TWL XL LVL3 (GOWN DISPOSABLE) ×1 IMPLANT
GOWN STRL REUS W/TWL LRG LVL3 (GOWN DISPOSABLE) ×4
GOWN STRL REUS W/TWL XL LVL3 (GOWN DISPOSABLE) ×2
GRAFT TISS 60-80 FRZN TENDON (Tissue) IMPLANT
IMMOBILIZER KNEE 22 UNIV (SOFTGOODS) ×1 IMPLANT
IMMOBILIZER KNEE 24 THIGH 36 (MISCELLANEOUS) ×1 IMPLANT
IMMOBILIZER KNEE 24 UNIV (MISCELLANEOUS) ×2
IV NS IRRIG 3000ML ARTHROMATIC (IV SOLUTION) ×8 IMPLANT
KIT IMPLANT TIGHTROPE ABS OPEN (Anchor) ×1 IMPLANT
KNEE WRAP E Z 3 GEL PACK (MISCELLANEOUS) ×2 IMPLANT
MANIFOLD NEPTUNE II (INSTRUMENTS) ×2 IMPLANT
NS IRRIG 1000ML POUR BTL (IV SOLUTION) ×2 IMPLANT
PACK ARTHROSCOPY DSU (CUSTOM PROCEDURE TRAY) ×2 IMPLANT
PACK BASIN DAY SURGERY FS (CUSTOM PROCEDURE TRAY) ×2 IMPLANT
PAD CAST 4YDX4 CTTN HI CHSV (CAST SUPPLIES) ×1 IMPLANT
PADDING CAST COTTON 4X4 STRL (CAST SUPPLIES) ×2
PADDING CAST COTTON 6X4 STRL (CAST SUPPLIES) ×2 IMPLANT
PENCIL BUTTON HOLSTER BLD 10FT (ELECTRODE) IMPLANT
SET ARTHROSCOPY TUBING (MISCELLANEOUS) ×2
SET ARTHROSCOPY TUBING LN (MISCELLANEOUS) ×1 IMPLANT
SLEEVE SCD COMPRESS KNEE MED (MISCELLANEOUS) ×1 IMPLANT
SPONGE LAP 4X18 X RAY DECT (DISPOSABLE) ×2 IMPLANT
STRIP CLOSURE SKIN 1/2X4 (GAUZE/BANDAGES/DRESSINGS) ×2 IMPLANT
SUCTION FRAZIER TIP 10 FR DISP (SUCTIONS) IMPLANT
SUT 2 FIBERLOOP 20 STRT BLUE (SUTURE)
SUT ETHILON 3 0 PS 1 (SUTURE) ×2 IMPLANT
SUT FIBERWIRE #2 38 T-5 BLUE (SUTURE)
SUT MNCRL AB 3-0 PS2 18 (SUTURE) ×2 IMPLANT
SUT VIC AB 2-0 SH 27 (SUTURE) ×2
SUT VIC AB 2-0 SH 27XBRD (SUTURE) ×1 IMPLANT
SUT VIC AB 3-0 SH 27 (SUTURE)
SUT VIC AB 3-0 SH 27X BRD (SUTURE) IMPLANT
SUT VICRYL 4-0 PS2 18IN ABS (SUTURE) IMPLANT
SUTURE 2 FIBERLOOP 20 STRT BLU (SUTURE) ×1 IMPLANT
SUTURE FIBERWR #2 38 T-5 BLUE (SUTURE) IMPLANT
TISSUE GRAFTLINK FGL (Tissue) ×2 IMPLANT
TOWEL OR 17X24 6PK STRL BLUE (TOWEL DISPOSABLE) ×4 IMPLANT
TOWEL OR NON WOVEN STRL DISP B (DISPOSABLE) ×2 IMPLANT
WATER STERILE IRR 1000ML POUR (IV SOLUTION) ×2 IMPLANT

## 2014-12-08 NOTE — Discharge Instructions (Signed)
[facility] [address] [place],  [address] [phone] A Division of [facility] Ninetta Lights, M.D.     Robert A. Noemi Chapel, M.D.     Almedia Balls, M.D. Johnny Bridge, M.D.    Joseph Pierini, M.D. Lanier Prude, M.D. Thurman Coyer, D.O.          Oretha Caprice, PA-C            Kirstin A. Shepperson, PA-C Joya Gaskins, OPA-C  ARTHROSCOPIC SURGERY POSTOPERATIVE INSTRUCTIONS - KNEE/ACL  You have just had and arthroscopic operation. Even though your incisions (puncture sites) are small and should heal quickly the structures inside your knee may take 6-8 weeks to heal and settle down.  This healing time is variable for each patient and will depend on what was done inside the knee at the time of surgery.  PAIN MEDICATION You will be given a prescription for pain medication. Please take this medication as written. Most patients will require medication only for a few days.  SWELLING You can expect some swelling in your knee, this is normal. Applying EZY-GEL Wrap (cold packs) and elevating your leg will help keep the swelling to a minimum. Your entire leg should be elevated, not just your knee. Elevate your leg to or above the level of your waist.  The cold packs should be used constantly during the first 48 hours along with continued elevation of your leg.  After that ice for at least 1 hour, 4 times per day.  DRESSING Fluid leakage is common the first few days.  Precautions to prevent staining of your clothes, bed sheets, etc. should be taken.  The fluid was used to inflate the joint during surgery and is commonly tinged red from the small amount of blood. Some bleeding or leakage from the puncture sites may occur for a few days. Do not change your dressing. Do not remove the Steri-Strips. Do not shower or get the wound wet.  SYMPTOMS TO REPORT TO YOUR DOCTOR Extreme pain. Extreme swelling.  Temperature above 101 degrees. Change in the feeling, color or movement in your toes. Redness, heat or swelling at your puncture sites.  ACTIVITY Non-weight bearing until follow up appointment.  Wear knee immobilizer at all times unless in CPM or doing exercises.    OFFICE CHECK-UP If no major problems arise and you are progressing well we will need to see you in one week. Please call the office to make an appointment. We will remove your sutures and discuss our surgery and rehabilitation at that time.    Post Anesthesia Home Care Instructions  Activity: Get plenty of rest for the remainder of the day. A responsible adult should stay with you for 24 hours following the procedure.  For the next 24 hours, DO NOT: -Drive a car -Paediatric nurse -Drink alcoholic beverages -Take any medication unless instructed by your physician -Make any legal decisions or sign important papers.  Meals: Start with liquid foods such as gelatin or soup. Progress to regular foods as tolerated. Avoid greasy, spicy, heavy foods. If nausea and/or vomiting occur, drink only clear liquids until the nausea and/or vomiting subsides. Call your physician if vomiting continues.  Special Instructions/Symptoms: Your throat may feel dry or sore from the anesthesia or the breathing tube placed in your throat during surgery. If this causes discomfort, gargle with warm salt water. The discomfort should disappear within 24 hours.  If you had a  scopolamine patch placed behind your ear for the management of post- operative nausea and/or vomiting:  1. The medication in the patch is effective for 72 hours, after which it should be removed.  Wrap patch in a tissue and discard in the trash. Wash hands thoroughly with soap and water. 2. You may remove the patch earlier than 72 hours if you experience unpleasant side effects which may include dry mouth, dizziness or visual disturbances. 3. Avoid touching the patch. Wash your hands  with soap and water after contact with the patch.   Call your surgeon if you experience:   1.  Fever over 101.0. 2.  Inability to urinate. 3.  Nausea and/or vomiting. 4.  Extreme swelling or bruising at the surgical site. 5.  Continued bleeding from the incision. 6.  Increased pain, redness or drainage from the incision. 7.  Problems related to your pain medication. 8. Any change in color, movement and/or sensation 9. Any problems and/or concerns   Regional Anesthesia Blocks  1. Numbness or the inability to move the "blocked" extremity may last from 3-48 hours after placement. The length of time depends on the medication injected and your individual response to the medication. If the numbness is not going away after 48 hours, call your surgeon.  2. The extremity that is blocked will need to be protected until the numbness is gone and the  Strength has returned. Because you cannot feel it, you will need to take extra care to avoid injury. Because it may be weak, you may have difficulty moving it or using it. You may not know what position it is in without looking at it while the block is in effect.  3. For blocks in the legs and feet, returning to weight bearing and walking needs to be done carefully. You will need to wait until the numbness is entirely gone and the strength has returned. You should be able to move your leg and foot normally before you try and bear weight or walk. You will need someone to be with you when you first try to ensure you do not fall and possibly risk injury.  4. Bruising and tenderness at the needle site are common side effects and will resolve in a few days.  5. Persistent numbness or new problems with movement should be communicated to the surgeon or the [facility] [phone] [place] [phone]).

## 2014-12-08 NOTE — Anesthesia Procedure Notes (Addendum)
Anesthesia Regional Block:  Adductor canal block  Pre-Anesthetic Checklist: ,, timeout performed, Correct Patient, Correct Site, Correct Laterality, Correct Procedure, Correct Position, site marked, Risks and benefits discussed,  Surgical consent,  Pre-op evaluation,  At surgeon's request and post-op pain management  Laterality: Right and Lower  Prep: chloraprep       Needles:  Injection technique: Single-shot  Needle Type: Echogenic Needle     Needle Length: 9cm 9 cm Needle Gauge: 21 and 21 G    Additional Needles:  Procedures: ultrasound guided (picture in chart) Adductor canal block Narrative:  Start time: 12/08/2014 10:15 AM End time: 12/08/2014 10:20 AM Injection made incrementally with aspirations every 5 mL.  Performed by: Personally  Anesthesiologist: CREWS, DAVID   Procedure Name: LMA Insertion Date/Time: 12/08/2014 12:18 PM Performed by: Lorelai Huyser D Pre-anesthesia Checklist: Patient identified, Emergency Drugs available, Suction available and Patient being monitored Patient Re-evaluated:Patient Re-evaluated prior to inductionOxygen Delivery Method: Circle System Utilized Preoxygenation: Pre-oxygenation with 100% oxygen Intubation Type: IV induction Ventilation: Mask ventilation without difficulty LMA: LMA inserted LMA Size: 4.0 Number of attempts: 1 Airway Equipment and Method: Bite block Placement Confirmation: positive ETCO2 Tube secured with: Tape Dental Injury: Teeth and Oropharynx as per pre-operative assessment

## 2014-12-08 NOTE — Progress Notes (Signed)
Assisted Dr. Al Corpus with right, ultrasound guided, popliteal/saphenous block. Side rails up, monitors on throughout procedure. See vital signs in flow sheet. Tolerated Procedure well.    Disregard previous note above.

## 2014-12-08 NOTE — Interval H&P Note (Signed)
History and Physical Interval Note:  12/08/2014 7:32 AM  Patrick [name]  has presented today for surgery, with the diagnosis of sprain of unspecified cruciate ligament of unspecified knee S83.509  The various methods of treatment have been discussed with the patient and family. After consideration of risks, benefits and other options for treatment, the patient has consented to  Procedure(s): RIGHT KNEE ARTHROSCOPY WITH ANTERIOR CRUCIATE LIGAMENT (ACL) REPAIR (Right) as a surgical intervention .  The patient's history has been reviewed, patient examined, no change in status, stable for surgery.  I have reviewed the patient's chart and labs.  Questions were answered to the patient's satisfaction.     [name]

## 2014-12-08 NOTE — Anesthesia Postprocedure Evaluation (Signed)
  Anesthesia Post-op Note  Patient: Patrick [name]  Procedure(s) Performed: Procedure(s): RIGHT KNEE ARTHROSCOPY WITH ANTERIOR CRUCIATE LIGAMENT (ACL) REPAIR (Right)  Patient Location: PACU  Anesthesia Type: General, regional   Level of Consciousness: awake, alert  and oriented  Airway and Oxygen Therapy: Patient Spontanous Breathing  Post-op Pain: none  Post-op Assessment: Post-op Vital signs reviewed  Post-op Vital Signs: Reviewed  Last Vitals:  Filed Vitals:   12/08/14 1430  BP: 117/72  Pulse: 66  Temp:   Resp: 12    Complications: No apparent anesthesia complications

## 2014-12-08 NOTE — Progress Notes (Signed)
Assisted Dr. Al Corpus with right, ultrasound guided, popliteal block. Side rails up, monitors on throughout procedure. See vital signs in flow sheet. Tolerated Procedure well.

## 2014-12-08 NOTE — Anesthesia Preprocedure Evaluation (Signed)
Anesthesia Evaluation  Patient identified by MRN, date of birth, ID band Patient awake    Reviewed: Allergy & Precautions, NPO status , Patient's Chart, lab work & pertinent test results  Airway Mallampati: I  TM Distance: >3 FB Neck ROM: Full    Dental  (+) Teeth Intact, Dental Advisory Given   Pulmonary  breath sounds clear to auscultation        Cardiovascular Rhythm:Regular Rate:Normal     Neuro/Psych    GI/Hepatic GERD-  Medicated and Controlled,  Endo/Other    Renal/GU      Musculoskeletal   Abdominal   Peds  Hematology   Anesthesia Other Findings   Reproductive/Obstetrics                             Anesthesia Physical Anesthesia Plan  ASA: II  Anesthesia Plan: General   Post-op Pain Management:    Induction: Intravenous  Airway Management Planned: LMA  Additional Equipment:   Intra-op Plan:   Post-operative Plan: Extubation in OR  Informed Consent: I have reviewed the patients History and Physical, chart, labs and discussed the procedure including the risks, benefits and alternatives for the proposed anesthesia with the patient or authorized representative who has indicated his/her understanding and acceptance.   Dental advisory given  Plan Discussed with: CRNA, Anesthesiologist and Surgeon  Anesthesia Plan Comments:         Anesthesia Quick Evaluation

## 2014-12-08 NOTE — Transfer of Care (Signed)
Immediate Anesthesia Transfer of Care Note  Patient: Patrick [name]  Procedure(s) Performed: Procedure(s): RIGHT KNEE ARTHROSCOPY WITH ANTERIOR CRUCIATE LIGAMENT (ACL) REPAIR (Right)  Patient Location: PACU  Anesthesia Type:GA combined with regional for post-op pain  Level of Consciousness: awake, alert , oriented and patient cooperative  Airway & Oxygen Therapy: Patient Spontanous Breathing and Patient connected to face mask oxygen  Post-op Assessment: Report given to RN and Post -op Vital signs reviewed and stable  Post vital signs: Reviewed and stable  Last Vitals:  Filed Vitals:   12/08/14 1035  BP:   Pulse: 72  Temp:   Resp: 15    Complications: No apparent anesthesia complications

## 2014-12-09 ENCOUNTER — Encounter (HOSPITAL_BASED_OUTPATIENT_CLINIC_OR_DEPARTMENT_OTHER): Payer: Self-pay | Admitting: Orthopedic Surgery

## 2014-12-09 NOTE — Op Note (Signed)
NAMEKASEEM, VASTINE NO.:  [id number]  MEDICAL RECORD NO.:  [id number]  LOCATION:                                FACILITY:  [facility]  PHYSICIAN:  Ninetta Lights, M.D. DATE OF BIRTH:  [birth year]  DATE OF PROCEDURE:  12/08/2014 DATE OF DISCHARGE:  12/08/2014                              OPERATIVE REPORT   PREOPERATIVE DIAGNOSES:  Right knee torn anterior cruciate ligament graft with anterolateral rotatory instability.  Medial meniscus tear.  POSTOPERATIVE DIAGNOSES:  Torn anterior cruciate ligament graft with anterolateral rotatory instability, right knee.  Unstable tear, posterior horn and lateral meniscus.  Previous partial medial meniscectomy without recurrent tear.  Focal area of chondromalacia, grade 3 and 4, patellofemoral joint in the trochlea with chondral loose bodies.  PROCEDURE:  Right knee exam under anesthesia with arthroscopy, revision anterior cruciate ligament reconstruction utilizing semitendinosus allograft, EndoButton fixation above, a washer fixation PushLock distally.  Notchplasty.  Partial lateral meniscectomy.  Chondroplasty of patellofemoral joint.  Removal of loose bodies.  Microfracturing.  SURGEON:  Ninetta Lights, M.D.  ASSISTANT:  Elmyra Ricks, P.A., present throughout the entire case and necessary for timely completion of procedure.  ANESTHESIA:  General.  BLOOD LOSS:  Minimal.  SPECIMENS:  None.  CULTURES:  None.  COMPLICATIONS:  None.  DRESSINGS:  Soft compressive knee immobilizer.  TOURNIQUET TIME:  1 hour.  DESCRIPTION OF PROCEDURE:  The patient was brought to the operating room, placed on the operating table in a supine position.  After adequate anesthesia had been obtained, tourniquet applied, prepped and draped in usual sterile fashion.  Exsanguinated with elevation of Esmarch.  Tourniquet was inflated to 350 mmHg.  Two portals; one each medial and lateral parapatellar.  Arthroscope was introduced.  Knee  was distended and inspected.  Of note, he had full motion with positive Lachman, drawer, and pivot shift.  At arthroscopy, intact articular cartilage and shift for the trochlea.  A focal area of grade 3 and 4. Chondral loose bodies debrided.  Brought to stable surface.  Half of this with exposed bone, a 1.5 cm in diameter.  Treated with microfracturing.  Medial compartment, medial meniscus, no change. Previous meniscectomy without recurrent tear.  Lateral compartment looked good, but there was an unstable flap tear off the posterior horn, saucerized out and tapered in smoothly, restating some of the rim.  All was left for the ACL graft, markedly attenuated and stretch nonfunctional.  Debrided.  Graft prepared for 10 mm tunnels.  Revision of notchplasty.  Small incision next to the tibial tubercle.  Guidewire, then 10-mm reamer out through the footprint of the ACL.  Femoral guide, guidewire, reamer for appropriate depth for the graft throughout. Debris cleared throughout.  Passing devices were equal both tunnels and out through the anterior cortex of femur.  Graft was pulled in across the knee, seating it well on both tunnels.  EndoButton fixed above, tensioned, confirmed good position on fluoroscopy.  At 70 degrees, the graft was tensioned and then fixed distally over a washer and then exiting sutures, anchored with a PushLock.  At completion, great stability, good fixation, good clearance of graft, full motion.  Wounds irrigated, closed.  Sterile compressive dressing applied.  Tourniquet was deflated and removed.  Knee immobilizer was applied.  Anesthesia was reversed.  Brought to the recovery room.  Tolerated the surgery well. No complications.     Ninetta Lights, M.D.     DFM/MEDQ  D:  12/08/2014  T:  12/09/2014  Job:  [id number]

## 2015-10-16 ENCOUNTER — Ambulatory Visit: Payer: BLUE CROSS/BLUE SHIELD | Admitting: Podiatry

## 2016-03-21 ENCOUNTER — Encounter: Payer: Self-pay | Admitting: Podiatry

## 2016-03-21 ENCOUNTER — Ambulatory Visit (INDEPENDENT_AMBULATORY_CARE_PROVIDER_SITE_OTHER): Payer: BLUE CROSS/BLUE SHIELD

## 2016-03-21 ENCOUNTER — Ambulatory Visit (INDEPENDENT_AMBULATORY_CARE_PROVIDER_SITE_OTHER): Payer: BLUE CROSS/BLUE SHIELD | Admitting: Podiatry

## 2016-03-21 DIAGNOSIS — M79671 Pain in right foot: Secondary | ICD-10-CM

## 2016-03-21 DIAGNOSIS — M722 Plantar fascial fibromatosis: Secondary | ICD-10-CM | POA: Diagnosis not present

## 2016-03-21 NOTE — Progress Notes (Signed)
   Subjective:    Patient ID: Patrick [name], male    DOB: May 25, 1956, 60 y.o.   MRN: [id number]  HPI Chief Complaint  Patient presents with  . Foot Pain    RT TOP OF FOOT IS BEEN SORE FOR 5 MONTHS. FOOT IS WORSE WHEN FLEXING. DR. TOM PRESCRIBE RX CORTISONE, ORTHOTICS-NO TREATMENT.      Review of Systems  Musculoskeletal: Positive for gait problem.       Objective:   Physical Exam        Assessment & Plan:

## 2016-03-22 DIAGNOSIS — R972 Elevated prostate specific antigen [PSA]: Secondary | ICD-10-CM | POA: Diagnosis not present
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
## 2016-03-28 ENCOUNTER — Ambulatory Visit (INDEPENDENT_AMBULATORY_CARE_PROVIDER_SITE_OTHER): Payer: BLUE CROSS/BLUE SHIELD

## 2016-03-28 DIAGNOSIS — M722 Plantar fascial fibromatosis: Secondary | ICD-10-CM

## 2016-03-28 NOTE — Progress Notes (Signed)
   Subjective:    Patient ID: Patrick [name], male    DOB: September 03, 1955, 60 y.o.   MRN: [id number]  HPI Pt presents with h/o of heel pain in Rt foot. He has had 1 ESWT treatment and states that he has noticed an improvement in his pain and he has been able to increase workout activities without any issues   Review of Systems Al other systems negative    Objective:   Physical Exam       Mild pain on palpation of Rt medial heel band, radiating toward metatarsals. Assessment & Plan:  ESWT administered to Rt heel and arch for 20 joules at 3000 pulses. EPAT administered to surround connective tissues. Procedure tolerated well, advised against use of NSAIDS and ice, advise on boot usage. Re-appointed in 1 week for 3rd treatment

## 2016-04-04 ENCOUNTER — Ambulatory Visit: Payer: BLUE CROSS/BLUE SHIELD

## 2016-04-04 DIAGNOSIS — M722 Plantar fascial fibromatosis: Secondary | ICD-10-CM

## 2016-04-04 NOTE — Progress Notes (Signed)
   Subjective:    Patient ID: Patrick [name], male    DOB: 07-08-1956, 60 y.o.   MRN: [id number]  HPI Pt presents with h/o of heel pain in Rt foot. He has had 1 ESWT treatment and states that he has noticed an improvement in his pain and he has been able to increase workout activities without any issues   Review of Systems Al other systems negative    Objective:   Physical Exam       Mild pain on palpation of Rt medial heel band, radiating toward metatarsals. Assessment & Plan:  ESWT administered to Rt heel and arch for 22 joules at 3000 pulses. EPAT administered to surround connective tissues. Procedure tolerated well, advised against use of NSAIDS and ice, advise on boot usage. Re-appointed in 4 weeks for re-evaluation and possible 4th treatment

## 2016-04-29 DIAGNOSIS — L570 Actinic keratosis: Secondary | ICD-10-CM | POA: Diagnosis not present

## 2016-04-29 DIAGNOSIS — C44319 Basal cell carcinoma of skin of other parts of face: Secondary | ICD-10-CM | POA: Diagnosis not present

## 2016-04-29 DIAGNOSIS — Z85828 Personal history of other malignant neoplasm of skin: Secondary | ICD-10-CM | POA: Diagnosis not present

## 2016-04-29 DIAGNOSIS — L821 Other seborrheic keratosis: Secondary | ICD-10-CM | POA: Diagnosis not present

## 2016-04-29 DIAGNOSIS — D2371 Other benign neoplasm of skin of right lower limb, including hip: Secondary | ICD-10-CM | POA: Diagnosis not present

## 2016-04-30 DIAGNOSIS — G5702 Lesion of sciatic nerve, left lower limb: Secondary | ICD-10-CM | POA: Diagnosis not present

## 2016-04-30 DIAGNOSIS — M545 Low back pain: Secondary | ICD-10-CM | POA: Diagnosis not present

## 2016-05-02 ENCOUNTER — Ambulatory Visit: Payer: BLUE CROSS/BLUE SHIELD

## 2016-05-02 DIAGNOSIS — M722 Plantar fascial fibromatosis: Secondary | ICD-10-CM

## 2016-05-02 NOTE — Progress Notes (Signed)
   Subjective:    Patient ID: Patrick [name], male    DOB: [birth year], 60 y.o.   MRN: [id number]  HPI Pt presents with h/ oplantar fasciitis that he says has improved tremendously   Review of Systems Al other systems negative    Objective:   Physical Exam       No pain on palpation of right heel  Assessment & Plan:  ESWT administered to Rt heel and arch for 22 joules at 3000 pulses. EPAT administered to surround connective tissues. Procedure tolerated well, advised against use of NSAIDS and ice, advise on boot usage. Re-appointed in 6 weeks for re-evaluation with Dr Paulla Dolly, if needed

## 2016-05-03 DIAGNOSIS — R972 Elevated prostate specific antigen [PSA]: Secondary | ICD-10-CM | POA: Diagnosis not present

## 2016-05-03 DIAGNOSIS — N400 Enlarged prostate without lower urinary tract symptoms: Secondary | ICD-10-CM | POA: Diagnosis not present

## 2016-05-08 DIAGNOSIS — M5416 Radiculopathy, lumbar region: Secondary | ICD-10-CM | POA: Diagnosis not present

## 2016-05-08 DIAGNOSIS — M4120 Other idiopathic scoliosis, site unspecified: Secondary | ICD-10-CM | POA: Diagnosis not present

## 2016-05-08 DIAGNOSIS — M545 Low back pain: Secondary | ICD-10-CM | POA: Diagnosis not present

## 2016-05-12 DIAGNOSIS — M545 Low back pain: Secondary | ICD-10-CM | POA: Diagnosis not present

## 2016-05-22 DIAGNOSIS — M25551 Pain in right hip: Secondary | ICD-10-CM | POA: Diagnosis not present

## 2016-05-22 DIAGNOSIS — M5416 Radiculopathy, lumbar region: Secondary | ICD-10-CM | POA: Diagnosis not present

## 2016-05-22 DIAGNOSIS — M545 Low back pain: Secondary | ICD-10-CM | POA: Diagnosis not present

## 2016-05-29 DIAGNOSIS — M5416 Radiculopathy, lumbar region: Secondary | ICD-10-CM | POA: Diagnosis not present

## 2016-05-29 DIAGNOSIS — M545 Low back pain: Secondary | ICD-10-CM | POA: Diagnosis not present

## 2016-05-29 DIAGNOSIS — M25551 Pain in right hip: Secondary | ICD-10-CM | POA: Diagnosis not present

## 2016-06-03 DIAGNOSIS — L718 Other rosacea: Secondary | ICD-10-CM | POA: Diagnosis not present

## 2016-06-03 DIAGNOSIS — L570 Actinic keratosis: Secondary | ICD-10-CM | POA: Diagnosis not present

## 2016-06-03 DIAGNOSIS — C44319 Basal cell carcinoma of skin of other parts of face: Secondary | ICD-10-CM | POA: Diagnosis not present

## 2016-06-11 ENCOUNTER — Other Ambulatory Visit: Payer: BLUE CROSS/BLUE SHIELD

## 2016-06-21 ENCOUNTER — Ambulatory Visit (INDEPENDENT_AMBULATORY_CARE_PROVIDER_SITE_OTHER): Payer: BLUE CROSS/BLUE SHIELD | Admitting: Podiatry

## 2016-06-21 DIAGNOSIS — M722 Plantar fascial fibromatosis: Secondary | ICD-10-CM | POA: Diagnosis not present

## 2016-06-22 NOTE — Progress Notes (Signed)
Subjective:     Patient ID: Patrick [name], male   DOB: Nov 04, 1955, 60 y.o.   MRN: [id number]  HPI patient states his heel is around 75% improved with mild discomfort   Review of Systems     Objective:   Physical Exam Neurovascular status intact with significant improvement heel arch with mild inflammation but minimal discomfort when palpated    Assessment:     Fasciitis-like symptoms present but improved    Plan:     Advised on physical therapy supportive shoes and anti-inflammatories and not going barefoot. We will not do any more shockwave that it may be necessary in future

## 2016-09-17 ENCOUNTER — Encounter (INDEPENDENT_AMBULATORY_CARE_PROVIDER_SITE_OTHER): Payer: Self-pay

## 2016-09-17 ENCOUNTER — Ambulatory Visit (INDEPENDENT_AMBULATORY_CARE_PROVIDER_SITE_OTHER): Payer: BLUE CROSS/BLUE SHIELD | Admitting: Podiatry

## 2016-09-17 ENCOUNTER — Encounter: Payer: Self-pay | Admitting: Podiatry

## 2016-09-17 DIAGNOSIS — M779 Enthesopathy, unspecified: Secondary | ICD-10-CM | POA: Diagnosis not present

## 2016-09-17 MED ORDER — DEXAMETHASONE SODIUM PHOSPHATE 120 MG/30ML IJ SOLN
4.0000 mg | Freq: Once | INTRAMUSCULAR | Status: AC
  Administered 2016-09-17: 4 mg via INTRA_ARTICULAR

## 2016-09-17 MED ORDER — MELOXICAM 15 MG PO TABS: 15.0000 mg | ORAL_TABLET | Freq: Every day | ORAL | 2 refills | Status: AC

## 2016-09-17 NOTE — Patient Instructions (Signed)
Start this next week   Peroneal Tendinopathy Rehab Ask your health care provider which exercises are safe for you. Do exercises exactly as told by your health care provider and adjust them as directed. It is normal to feel mild stretching, pulling, tightness, or discomfort as you do these exercises, but you should stop right away if you feel sudden pain or your pain gets worse.Do not begin these exercises until told by your health care provider. Stretching and range of motion exercises These exercises warm up your muscles and joints and improve the movement and flexibility of your ankle. These exercises also help to relieve pain and stiffness. Exercise A: Gastroc and soleus, standing 1. Stand on the edge of a step on the balls of your feet. The ball of your foot is on the walking surface, right under your toes. 2. Hold onto the railing for balance. 3. Slowly lift your left / right foot, allowing your body weight to press your left / right heel down over the edge of the step. You should feel a stretch in your left / right calf. 4. Hold this position for __________ seconds. Repeat __________ times with your left / right knee straight and __________ times with your left / right knee bent. Complete this stretch __________ times per day. Strengthening exercises These exercises improve the strength and endurance of your foot and ankle. Endurance is the ability to use your muscles for a long time, even after they get tired. Exercise B: Dorsiflexors 1. Secure a rubber exercise band or tube to an object, like a table leg, that will not move if it is pulled on. 2. Secure the other end of the band around your left / right foot. 3. Sit on the floor, facing the object with your left / right foot extended. The band or tube should be slightly tense when your foot is relaxed. 4. Slowly flex your left / right ankle and toes to bring your foot toward you. 5. Hold this position for __________ seconds. 6. Slowly  return your foot to the starting position. Repeat __________ times. Complete this exercise __________ times per day. Exercise C: Evertors 1. Sit on the floor with your legs straight out in front of you. 2. Loop a rubber exercise or band or tube around the ball of your left / right foot. The ball of your foot is on the walking surface, right under your toes. 3. Hold the ends of the band in your hands, or secure the band to a stable object. 4. Slowly push your foot outward, away from your other leg. 5. Hold this position for __________ seconds. 6. Slowly return your foot to the starting position. Repeat __________ times. Complete this exercise __________ times per day. Exercise D: Standing heel raise (plantar flexion) 1. Stand with your feet shoulder-width apart with the balls of your feet on a step. The ball of your foot is on the walking surface, right under your toes. 2. Keep your weight spread evenly over the width of your feet while you rise up on your toes. Use a wall or railing to steady yourself, but try not to use it for support. 3. If this exercise is too easy, try these options:  Shift your weight toward your left / right leg until you feel challenged.  If told by your health care provider, stand on your left / right leg only. 4. Hold this position for __________ seconds. Repeat __________ times. Complete this exercise __________ times per day. Exercise E:  Single leg stand 1. Without shoes, stand near a railing or in a doorway. You may hold onto the railing or door frame as needed. 2. Stand on your left / right foot. Keep your big toe down on the floor and try to keep your arch lifted.  Do not roll to the outside of your foot.  If this exercise is too easy, you can try it with your eyes closed or while standing on a pillow. 3. Hold this position for __________ seconds. Repeat __________ times. Complete this exercise __________ times per day. This information is not intended to  replace advice given to you by your health care provider. Make sure you discuss any questions you have with your health care provider. Document Released: 07/29/2005 Document Revised: 04/04/2016 Document Reviewed: 06/17/2015 Elsevier Interactive Patient Education  2017 [employer].

## 2016-09-17 NOTE — Progress Notes (Signed)
Subjective: 61 year old male presents the office today for concerns of pain to the outside aspect of his right foot which is been ongoing for the last couple months. He denies any recent injury or trauma denies any treatment. He as a history of plantar fasciitis. He has previously tried a night splint , bracing orthotics for this. The heel pain is doing well. He states the pain in the outside aspect of the foot is becoming more continuous. Denies any systemic complaints such as fevers, chills, nausea, vomiting. No acute changes since last appointment, and no other complaints at this time.   Objective: AAO x3, NAD DP/PT pulses palpable bilaterally, CRT less than 3 seconds There is tenderness just proximal to the right fifth metatarsal base on the insertion of the plantar fascia. There is no pain on the fifth metatarsal base. There is trace edema to this area without any erythema or increase in warmth. The peroneal tendons appear to be intact. There is no other areas of tenderness identified bilaterally. Upon evaluation of his orthotics is right heel is still somewhat in valgus position. No open lesions or pre-ulcerative lesions.  No pain with calf compression, swelling, warmth, erythema  Assessment: Peroneal ail tendinitis right foot  Plan: -All treatment options discussed with the patient including all alternatives, risks, complications.  -Discuss a steroid injection and he wishes to proceed. Under sterileconditions a mixture of dexamethasone and local anesthetic was infiltrated around the peroneal tendon with care notto inject directly into the tendon. Post injection care was discussed. -Plantar fascial brace applied from medial to lateral. -Prescribed mobic. Discussed side effects of the medication and directed to stop if any are to occur and call the office.  -Hold high impact activity.  -I did modify his orthotic increase the arch to see if this helps. Discussed possibly a new  orthotic. -Follow-up in 3 weeks or sooner if any problems arise. In the meantime, encouraged to call the office with any questions, concerns, change in symptoms.   Celesta Gentile, DPM

## 2016-09-23 ENCOUNTER — Ambulatory Visit: Payer: BLUE CROSS/BLUE SHIELD | Admitting: Podiatry

## 2016-10-15 ENCOUNTER — Ambulatory Visit: Payer: BLUE CROSS/BLUE SHIELD | Admitting: Podiatry

## 2016-10-22 ENCOUNTER — Encounter: Payer: Self-pay | Admitting: Podiatry

## 2016-10-22 ENCOUNTER — Ambulatory Visit (INDEPENDENT_AMBULATORY_CARE_PROVIDER_SITE_OTHER): Payer: BLUE CROSS/BLUE SHIELD | Admitting: Podiatry

## 2016-10-22 DIAGNOSIS — M779 Enthesopathy, unspecified: Secondary | ICD-10-CM | POA: Diagnosis not present

## 2016-10-22 DIAGNOSIS — M722 Plantar fascial fibromatosis: Secondary | ICD-10-CM | POA: Diagnosis not present

## 2016-10-23 NOTE — Progress Notes (Signed)
Subjective: 61 year old male presents the office today for follow-up evaluation of right foot pain. He states that the pain is about 2/10. He has returned to [place] and he is doing better. He states adding more arch support has helped but he is rolling out to the outside aspect of his ankle. He has not had any swelling or redness. Denies any systemic complaints such as fevers, chills, nausea, vomiting. No acute changes since last appointment, and no other complaints at this time.   Objective: AAO x3, NAD DP/PT pulses palpable bilaterally, CRT less than 3 seconds There is minimal tenderness just proximal to the right fifth metatarsal base on the insertion of the peroneal tendon. The peroneal tendon appears to be intact. There is no area pinpoint bony tenderness or pain the vibratory sensation. There is no amount edema, erythema, increase in warmth. There is no pain along the course or insertion of the plantar fascia. Cavus foot type No open lesions or pre-ulcerative lesions.  No pain with calf compression, swelling, warmth, erythema  Assessment: Peroneal tendinitis right foot  Plan: -All treatment options discussed with the patient including all alternatives, risks, complications.  -at this time I did modify his insert further help take pressure off the peroneal tendons. At this point I believe that he likely benefit from a new orthotic and he wishes to proceed with this. I will have him follow-up with Lil[facility] Channel for casting of a new insert.  -Rehab exercises for peroneal tendonitis.  -Follow-up in 3 weeks after ordering orthtoics or sooner if any problems arise. In the meantime, encouraged to call the office with any questions, concerns, change in symptoms.   Celesta Gentile, DPM

## 2016-10-29 ENCOUNTER — Ambulatory Visit (INDEPENDENT_AMBULATORY_CARE_PROVIDER_SITE_OTHER): Payer: Self-pay | Admitting: Podiatry

## 2016-10-29 DIAGNOSIS — M722 Plantar fascial fibromatosis: Secondary | ICD-10-CM

## 2016-11-08 NOTE — Progress Notes (Signed)
Patient measured for orthotics. He was seen by Jesus Genera.  Follow-up in 3 weeks to [facility]

## 2016-11-20 ENCOUNTER — Other Ambulatory Visit: Payer: BLUE CROSS/BLUE SHIELD

## 2017-04-15 DIAGNOSIS — N400 Enlarged prostate without lower urinary tract symptoms: Secondary | ICD-10-CM | POA: Diagnosis not present

## 2017-05-14 DIAGNOSIS — N400 Enlarged prostate without lower urinary tract symptoms: Secondary | ICD-10-CM | POA: Diagnosis not present

## 2017-05-14 DIAGNOSIS — R972 Elevated prostate specific antigen [PSA]: Secondary | ICD-10-CM | POA: Diagnosis not present

## 2017-05-14 DIAGNOSIS — E291 Testicular hypofunction: Secondary | ICD-10-CM | POA: Diagnosis not present

## 2017-05-29 DIAGNOSIS — Z85828 Personal history of other malignant neoplasm of skin: Secondary | ICD-10-CM | POA: Diagnosis not present

## 2017-05-29 DIAGNOSIS — L820 Inflamed seborrheic keratosis: Secondary | ICD-10-CM | POA: Diagnosis not present

## 2017-05-29 DIAGNOSIS — D1801 Hemangioma of skin and subcutaneous tissue: Secondary | ICD-10-CM | POA: Diagnosis not present

## 2017-05-29 DIAGNOSIS — L700 Acne vulgaris: Secondary | ICD-10-CM | POA: Diagnosis not present

## 2017-05-29 DIAGNOSIS — L814 Other melanin hyperpigmentation: Secondary | ICD-10-CM | POA: Diagnosis not present

## 2017-05-29 DIAGNOSIS — L720 Epidermal cyst: Secondary | ICD-10-CM | POA: Diagnosis not present

## 2017-05-29 DIAGNOSIS — L570 Actinic keratosis: Secondary | ICD-10-CM | POA: Diagnosis not present

## 2017-06-09 DIAGNOSIS — Z6826 Body mass index (BMI) 26.0-26.9, adult: Secondary | ICD-10-CM | POA: Diagnosis not present

## 2017-06-09 DIAGNOSIS — R05 Cough: Secondary | ICD-10-CM | POA: Diagnosis not present

## 2017-06-09 DIAGNOSIS — J40 Bronchitis, not specified as acute or chronic: Secondary | ICD-10-CM | POA: Diagnosis not present

## 2017-09-01 DIAGNOSIS — M25511 Pain in right shoulder: Secondary | ICD-10-CM | POA: Diagnosis not present

## 2017-09-01 DIAGNOSIS — M25512 Pain in left shoulder: Secondary | ICD-10-CM | POA: Diagnosis not present

## 2017-09-25 DIAGNOSIS — Z125 Encounter for screening for malignant neoplasm of prostate: Secondary | ICD-10-CM | POA: Diagnosis not present

## 2017-09-25 DIAGNOSIS — Z0000 Encounter for general adult medical examination without abnormal findings: Secondary | ICD-10-CM | POA: Diagnosis not present

## 2017-09-25 DIAGNOSIS — E7849 Other hyperlipidemia: Secondary | ICD-10-CM | POA: Diagnosis not present

## 2017-09-29 DIAGNOSIS — M19012 Primary osteoarthritis, left shoulder: Secondary | ICD-10-CM | POA: Diagnosis not present

## 2017-09-29 DIAGNOSIS — M19011 Primary osteoarthritis, right shoulder: Secondary | ICD-10-CM | POA: Diagnosis not present

## 2017-09-30 DIAGNOSIS — Z1389 Encounter for screening for other disorder: Secondary | ICD-10-CM | POA: Diagnosis not present

## 2017-09-30 DIAGNOSIS — E7849 Other hyperlipidemia: Secondary | ICD-10-CM | POA: Diagnosis not present

## 2017-09-30 DIAGNOSIS — R972 Elevated prostate specific antigen [PSA]: Secondary | ICD-10-CM | POA: Diagnosis not present

## 2017-09-30 DIAGNOSIS — M1711 Unilateral primary osteoarthritis, right knee: Secondary | ICD-10-CM | POA: Diagnosis not present

## 2017-09-30 DIAGNOSIS — Z0000 Encounter for general adult medical examination without abnormal findings: Secondary | ICD-10-CM | POA: Diagnosis not present

## 2017-09-30 DIAGNOSIS — C4491 Basal cell carcinoma of skin, unspecified: Secondary | ICD-10-CM | POA: Diagnosis not present

## 2017-10-02 DIAGNOSIS — Z1212 Encounter for screening for malignant neoplasm of rectum: Secondary | ICD-10-CM | POA: Diagnosis not present

## 2017-10-29 DIAGNOSIS — K429 Umbilical hernia without obstruction or gangrene: Secondary | ICD-10-CM | POA: Diagnosis not present

## 2017-10-29 DIAGNOSIS — Z6826 Body mass index (BMI) 26.0-26.9, adult: Secondary | ICD-10-CM | POA: Diagnosis not present

## 2017-10-30 ENCOUNTER — Other Ambulatory Visit: Payer: Self-pay | Admitting: Family Medicine

## 2017-10-30 DIAGNOSIS — K429 Umbilical hernia without obstruction or gangrene: Secondary | ICD-10-CM

## 2017-11-11 DIAGNOSIS — Z85828 Personal history of other malignant neoplasm of skin: Secondary | ICD-10-CM | POA: Diagnosis not present

## 2017-11-11 DIAGNOSIS — L570 Actinic keratosis: Secondary | ICD-10-CM | POA: Diagnosis not present

## 2017-11-11 DIAGNOSIS — L821 Other seborrheic keratosis: Secondary | ICD-10-CM | POA: Diagnosis not present

## 2017-11-11 DIAGNOSIS — L738 Other specified follicular disorders: Secondary | ICD-10-CM | POA: Diagnosis not present

## 2017-11-11 DIAGNOSIS — D225 Melanocytic nevi of trunk: Secondary | ICD-10-CM | POA: Diagnosis not present

## 2017-11-11 DIAGNOSIS — C44329 Squamous cell carcinoma of skin of other parts of face: Secondary | ICD-10-CM | POA: Diagnosis not present

## 2017-11-11 DIAGNOSIS — C44519 Basal cell carcinoma of skin of other part of trunk: Secondary | ICD-10-CM | POA: Diagnosis not present

## 2017-11-18 ENCOUNTER — Other Ambulatory Visit: Payer: BLUE CROSS/BLUE SHIELD

## 2017-11-22 ENCOUNTER — Other Ambulatory Visit: Payer: BLUE CROSS/BLUE SHIELD

## 2017-11-24 ENCOUNTER — Encounter: Payer: Self-pay | Admitting: Internal Medicine

## 2017-11-29 ENCOUNTER — Other Ambulatory Visit: Payer: Self-pay

## 2017-12-25 ENCOUNTER — Ambulatory Visit: Payer: Self-pay | Admitting: Surgery

## 2017-12-25 DIAGNOSIS — K429 Umbilical hernia without obstruction or gangrene: Secondary | ICD-10-CM | POA: Diagnosis not present

## 2017-12-25 NOTE — H&P (Signed)
Patrick [name] Documented: 12/25/2017 12:09 PM Location: [facility] Patient #: [id number] DOB: 1956-06-08 Married / Language: [name] / Race: White Male  History of Present Illness (Chelsea A. Kae Heller MD; 12/25/2017 12:22 PM) Patient words: This is a very pleasant and otherwise healthy 62 year old man who presents with an umbilical hernia. He first is his bellybutton looked funny a week or 2 ago. 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 This is the first time he has had a problem. Not really having any pain. He does wear a belt when he does weight lifting. Would like to have it fixed.  The patient is a 62 year old male.   Past Surgical History Levonne Spiller, CMA; 12/25/2017 12:09 PM) Knee Surgery Right. Shoulder Surgery Left.  Diagnostic Studies History Levonne Spiller, CMA; 12/25/2017 12:09 PM) Colonoscopy 1-5 years ago  Allergies Levonne Spiller, [place]; 12/25/2017 12:10 PM) Penicillins Allergies Reconciled  Medication History Levonne Spiller, CMA; 12/25/2017 12:11 PM) PriLOSEC (10MG  Packet, Oral) Active. ZyrTEC Allergy (10MG  Capsule, Oral) Active. Medications Reconciled  Social History Levonne Spiller, CMA; 12/25/2017 12:09 PM) Alcohol use Occasional alcohol use. Caffeine use Carbonated beverages, Coffee. No drug use Tobacco use Never smoker.  Family History Levonne Spiller, CMA; 12/25/2017 12:09 PM) Alcohol Abuse Father, Mother. Cancer Mother. Diabetes Mellitus Father. Migraine Headache Father.  Other Problems Levonne Spiller, CMA; 12/25/2017 12:09 PM) Arthritis Back Pain Gastroesophageal Reflux Disease     Review of Systems Andee Poles Gerrigner CMA; 12/25/2017 12:09 PM) General Not Present- Appetite Loss, Chills, Fatigue, Fever, Night Sweats, Weight Gain and Weight Loss. Skin Not Present- Change in Wart/Mole, Dryness, Hives, Jaundice, New  Lesions, Non-Healing Wounds, Rash and Ulcer. HEENT Not Present- Earache, Hearing Loss, Hoarseness, Nose Bleed, Oral Ulcers, Ringing in the Ears, Seasonal Allergies, Sinus Pain, Sore Throat, Visual Disturbances, Wears glasses/contact lenses and Yellow Eyes. Respiratory Not Present- Bloody sputum, Chronic Cough, Difficulty Breathing, Snoring and Wheezing. Breast Not Present- Breast Mass, Breast Pain, Nipple Discharge and Skin Changes. Cardiovascular Not Present- Chest Pain, Difficulty Breathing Lying Down, Leg Cramps, Palpitations, Rapid Heart Rate, Shortness of Breath and Swelling of Extremities. Gastrointestinal Not Present- Abdominal Pain, Bloating, Bloody Stool, Change in Bowel Habits, Chronic diarrhea, Constipation, Difficulty Swallowing, Excessive gas, Gets full quickly at meals, Hemorrhoids, Indigestion, Nausea, Rectal Pain and Vomiting. Male Genitourinary Not Present- Blood in Urine, Change in Urinary Stream, Frequency, Impotence, Nocturia, Painful Urination, Urgency and Urine Leakage. Musculoskeletal Present- Back Pain, Joint Pain and Muscle Pain. Not Present- Joint Stiffness, Muscle Weakness and Swelling of Extremities. Neurological Not Present- Decreased Memory, Fainting, Headaches, Numbness, Seizures, Tingling, Tremor, Trouble walking and Weakness. Psychiatric Not Present- Anxiety, Bipolar, Change in Sleep Pattern, Depression, Fearful and Frequent crying. Endocrine Not Present- Cold Intolerance, Excessive Hunger, Hair Changes, Heat Intolerance, Hot flashes and New Diabetes. Hematology Not Present- Blood Thinners, Easy Bruising, Excessive bleeding, Gland problems, HIV and Persistent Infections.  Vitals Andee Poles Gerrigner CMA; 12/25/2017 12:11 PM) 12/25/2017 12:11 PM Weight: 198 lb Height: 73in Body Surface Area: 2.14 m Body Mass Index: 26.12 kg/m  Temp.: 98.26F(Oral)  Pulse: 68 (Regular)  BP: 120/70 (Sitting, Left Arm, Standard)      Physical Exam (Chelsea A. Kae Heller MD;  12/25/2017 12:22 PM)  The physical exam findings are as follows: Note:Gen: alert and well appearing Eye: extraocular motion intact, no scleral icterus ENT: moist mucus membranes, dentition intact Neck: no mass or thyromegaly Chest: unlabored respirations, symmetrical air entry, clear bilaterally CV: regular rate and  rhythm, no pedal edema Abdomen: soft, nontender, nondistended. No mass or organomegaly. Reducible umbilical hernia MSK: strength symmetrical throughout, no deformity Neuro: grossly intact, normal gait Psych: normal mood and affect, appropriate insight Skin: warm and dry, no rash or lesion on limited exam    Assessment & Plan (Chelsea A. Kae Heller MD; 5/42/7062 37:62 PM)  UMBILICAL HERNIA (G31.5) Story: Reducible and on the fairly symptomatic. We discussed option of ongoing observation which in his case would probably be guaranteed to end up with surgery down the line given his proclivity for strenuous physical activity. Discussed small risks of increasing size, pain, or incarceration and signs and symptoms that should prompt him to the ER. Also discussed umbilical hernia repair with mesh including risks of bleeding, infection, pain, scarring, intra-abdominal injury, and hernia recurrence. Discussed use of mesh given his high activity level. He expressed understanding, questions were answered to his satisfaction. He would like to schedule surgery in about a month so we will have him meet with the schedulers today.

## 2018-05-13 DIAGNOSIS — L814 Other melanin hyperpigmentation: Secondary | ICD-10-CM | POA: Diagnosis not present

## 2018-05-13 DIAGNOSIS — C44519 Basal cell carcinoma of skin of other part of trunk: Secondary | ICD-10-CM | POA: Diagnosis not present

## 2018-05-13 DIAGNOSIS — D225 Melanocytic nevi of trunk: Secondary | ICD-10-CM | POA: Diagnosis not present

## 2018-05-13 DIAGNOSIS — L570 Actinic keratosis: Secondary | ICD-10-CM | POA: Diagnosis not present

## 2018-05-13 DIAGNOSIS — Z85828 Personal history of other malignant neoplasm of skin: Secondary | ICD-10-CM | POA: Diagnosis not present

## 2018-05-13 DIAGNOSIS — L821 Other seborrheic keratosis: Secondary | ICD-10-CM | POA: Diagnosis not present

## 2018-06-22 ENCOUNTER — Encounter (HOSPITAL_BASED_OUTPATIENT_CLINIC_OR_DEPARTMENT_OTHER): Payer: Self-pay

## 2018-06-22 ENCOUNTER — Emergency Department (HOSPITAL_BASED_OUTPATIENT_CLINIC_OR_DEPARTMENT_OTHER): Payer: BLUE CROSS/BLUE SHIELD

## 2018-06-22 ENCOUNTER — Emergency Department (HOSPITAL_BASED_OUTPATIENT_CLINIC_OR_DEPARTMENT_OTHER)
Admission: EM | Admit: 2018-06-22 | Discharge: 2018-06-23 | Disposition: A | Discharge status: Home / Self Care | Payer: BLUE CROSS/BLUE SHIELD | Attending: Emergency Medicine | Admitting: Emergency Medicine

## 2018-06-22 ENCOUNTER — Other Ambulatory Visit: Payer: Self-pay

## 2018-06-22 DIAGNOSIS — K429 Umbilical hernia without obstruction or gangrene: Secondary | ICD-10-CM

## 2018-06-22 DIAGNOSIS — R1033 Periumbilical pain: Secondary | ICD-10-CM | POA: Diagnosis not present

## 2018-06-22 DIAGNOSIS — K5790 Diverticulosis of intestine, part unspecified, without perforation or abscess without bleeding: Secondary | ICD-10-CM | POA: Diagnosis not present

## 2018-06-22 LAB — CBC
HEMATOCRIT: 46 % (ref 39.0–52.0)
Hemoglobin: 15 g/dL (ref 13.0–17.0)
MCH: 30.8 pg (ref 26.0–34.0)
MCHC: 32.6 g/dL (ref 30.0–36.0)
MCV: 94.5 fL (ref 80.0–100.0)
NRBC: 0 % (ref 0.0–0.2)
Platelets: 285 10*3/uL (ref 150–400)
RBC: 4.87 MIL/uL (ref 4.22–5.81)
RDW: 13.1 % (ref 11.5–15.5)
WBC: 8 10*3/uL (ref 4.0–10.5)

## 2018-06-22 LAB — BASIC METABOLIC PANEL
ANION GAP: 8 (ref 5–15)
BUN: 26 mg/dL — ABNORMAL HIGH (ref 6–20)
CALCIUM: 8.8 mg/dL — AB (ref 8.9–10.3)
CHLORIDE: 106 mmol/L (ref 98–111)
CO2: 24 mmol/L (ref 22–32)
Creatinine, Ser: 0.95 mg/dL (ref 0.61–1.24)
GFR calc Af Amer: >60 mL/min (ref >60)
GFR calc non Af Amer: >60 mL/min (ref >60)
GLUCOSE: 114 mg/dL — AB (ref 70–99)
POTASSIUM: 3.5 mmol/L (ref 3.5–5.1)
Sodium: 138 mmol/L (ref 135–145)

## 2018-06-22 MED ORDER — IOPAMIDOL (ISOVUE-300) INJECTION 61%
100.0000 mL | Freq: Once | INTRAVENOUS | Status: AC | PRN
  Administered 2018-06-22: 100 mL via INTRAVENOUS

## 2018-06-22 NOTE — ED Triage Notes (Signed)
Pt reports hx of umbilical hernia x 3 months-worse today after working out-NAD-steady gait

## 2018-06-22 NOTE — ED Notes (Signed)
Patient transported to CT 
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
## 2018-07-01 DIAGNOSIS — K429 Umbilical hernia without obstruction or gangrene: Secondary | ICD-10-CM | POA: Diagnosis not present

## 2018-07-03 DIAGNOSIS — K219 Gastro-esophageal reflux disease without esophagitis: Secondary | ICD-10-CM | POA: Diagnosis not present

## 2018-07-03 DIAGNOSIS — K420 Umbilical hernia with obstruction, without gangrene: Secondary | ICD-10-CM | POA: Diagnosis not present

## 2018-07-29 DIAGNOSIS — E291 Testicular hypofunction: Secondary | ICD-10-CM | POA: Diagnosis not present

## 2018-07-29 DIAGNOSIS — R948 Abnormal results of function studies of other organs and systems: Secondary | ICD-10-CM | POA: Diagnosis not present

## 2018-08-11 DIAGNOSIS — R972 Elevated prostate specific antigen [PSA]: Secondary | ICD-10-CM | POA: Diagnosis not present

## 2018-08-12 HISTORY — PX: COLONOSCOPY: SHX174

## 2018-09-23 DIAGNOSIS — Z125 Encounter for screening for malignant neoplasm of prostate: Secondary | ICD-10-CM | POA: Diagnosis not present

## 2018-09-23 DIAGNOSIS — R82998 Other abnormal findings in urine: Secondary | ICD-10-CM | POA: Diagnosis not present

## 2018-09-23 DIAGNOSIS — Z0000 Encounter for general adult medical examination without abnormal findings: Secondary | ICD-10-CM | POA: Diagnosis not present

## 2018-09-24 ENCOUNTER — Encounter: Payer: Self-pay | Admitting: Internal Medicine

## 2018-10-01 DIAGNOSIS — R5383 Other fatigue: Secondary | ICD-10-CM | POA: Diagnosis not present

## 2018-10-01 DIAGNOSIS — Z0000 Encounter for general adult medical examination without abnormal findings: Secondary | ICD-10-CM | POA: Diagnosis not present

## 2018-10-01 DIAGNOSIS — R972 Elevated prostate specific antigen [PSA]: Secondary | ICD-10-CM | POA: Diagnosis not present

## 2018-10-01 DIAGNOSIS — Z1331 Encounter for screening for depression: Secondary | ICD-10-CM | POA: Diagnosis not present

## 2018-10-01 DIAGNOSIS — Z1339 Encounter for screening examination for other mental health and behavioral disorders: Secondary | ICD-10-CM | POA: Diagnosis not present

## 2018-10-01 DIAGNOSIS — K429 Umbilical hernia without obstruction or gangrene: Secondary | ICD-10-CM | POA: Diagnosis not present

## 2018-10-01 DIAGNOSIS — E7849 Other hyperlipidemia: Secondary | ICD-10-CM | POA: Diagnosis not present

## 2018-10-20 ENCOUNTER — Telehealth: Payer: Self-pay

## 2018-10-20 ENCOUNTER — Ambulatory Visit (AMBULATORY_SURGERY_CENTER): Payer: Self-pay | Admitting: *Deleted

## 2018-10-20 ENCOUNTER — Encounter: Payer: Self-pay | Admitting: Internal Medicine

## 2018-10-20 ENCOUNTER — Other Ambulatory Visit: Payer: Self-pay

## 2018-10-20 VITALS — Ht 73.0 in | Wt 195.0 lb

## 2018-10-20 DIAGNOSIS — Z86010 Personal history of colonic polyps: Secondary | ICD-10-CM

## 2018-10-20 MED ORDER — SUPREP BOWEL PREP KIT 17.5-3.13-1.6 GM/177ML PO SOLN: 1.0000 | Freq: Once | ORAL | 0 refills | Status: AC

## 2018-10-20 NOTE — Telephone Encounter (Signed)
Pt did not show for previsit today at 8:30 pt has been rescheduled for today at 2:30 in room 52.

## 2018-10-20 NOTE — Progress Notes (Signed)
No egg or soy allergy known to patient  No issues with past sedation with any surgeries  or procedures, no intubation problems  No diet pills per patient No home 02 use per patient  No blood thinners per patient  Pt denies issues with constipation  No A fib or A flutter  EMMI video sent to pt's e mail  

## 2018-11-02 ENCOUNTER — Telehealth: Payer: Self-pay | Admitting: Internal Medicine

## 2018-11-02 NOTE — Telephone Encounter (Signed)
Hi Dr. Henrene Pastor, this pt just cancelled his colonoscopy that was scheduled with you for tomorrow due to insurance coverage. He has not rescheduled at this time. Thank you.

## 2018-11-03 ENCOUNTER — Encounter: Payer: BLUE CROSS/BLUE SHIELD | Admitting: Internal Medicine

## 2018-12-15 ENCOUNTER — Other Ambulatory Visit: Payer: Self-pay | Admitting: Orthopedic Surgery

## 2018-12-15 ENCOUNTER — Other Ambulatory Visit: Payer: Self-pay | Admitting: Specialist

## 2018-12-15 DIAGNOSIS — M19012 Primary osteoarthritis, left shoulder: Secondary | ICD-10-CM | POA: Diagnosis not present

## 2018-12-15 DIAGNOSIS — M542 Cervicalgia: Secondary | ICD-10-CM | POA: Diagnosis not present

## 2018-12-15 DIAGNOSIS — M25512 Pain in left shoulder: Secondary | ICD-10-CM

## 2018-12-17 ENCOUNTER — Other Ambulatory Visit: Payer: BLUE CROSS/BLUE SHIELD

## 2018-12-23 ENCOUNTER — Ambulatory Visit
Admission: RE | Admit: 2018-12-23 | Discharge: 2018-12-23 | Disposition: A | Discharge status: Home / Self Care | Payer: BLUE CROSS/BLUE SHIELD | Source: Ambulatory Visit | Attending: Orthopedic Surgery | Admitting: Orthopedic Surgery

## 2018-12-23 ENCOUNTER — Other Ambulatory Visit: Payer: Self-pay

## 2018-12-23 ENCOUNTER — Other Ambulatory Visit: Payer: BLUE CROSS/BLUE SHIELD

## 2018-12-23 DIAGNOSIS — M25512 Pain in left shoulder: Secondary | ICD-10-CM | POA: Diagnosis not present

## 2019-01-05 DIAGNOSIS — M25512 Pain in left shoulder: Secondary | ICD-10-CM | POA: Diagnosis not present

## 2019-01-05 DIAGNOSIS — M19012 Primary osteoarthritis, left shoulder: Secondary | ICD-10-CM | POA: Diagnosis not present

## 2019-01-07 ENCOUNTER — Encounter: Payer: Self-pay | Admitting: Internal Medicine

## 2019-01-07 ENCOUNTER — Other Ambulatory Visit: Payer: Self-pay

## 2019-01-07 ENCOUNTER — Ambulatory Visit (AMBULATORY_SURGERY_CENTER): Payer: Self-pay

## 2019-01-07 VITALS — Ht 73.0 in | Wt 195.0 lb

## 2019-01-07 DIAGNOSIS — Z86010 Personal history of colonic polyps: Secondary | ICD-10-CM

## 2019-01-07 MED ORDER — NA SULFATE-K SULFATE-MG SULF 17.5-3.13-1.6 GM/177ML PO SOLN: 1.0000 | Freq: Once | ORAL | 0 refills | Status: AC

## 2019-01-07 NOTE — Progress Notes (Signed)
Denies allergies to eggs or soy products. Denies complication of anesthesia or sedation. Denies use of weight loss medication. Denies use of O2.   Emmi instructions given for colonoscopy.   Pre-Visit was conducted by phone due to Covid 19. Instructions were reviewed with patient and mailed to confirmed home address. Patient was encouraged to call if he has any questions or concerns regarding instructions.

## 2019-01-11 ENCOUNTER — Telehealth: Payer: Self-pay

## 2019-01-11 NOTE — Telephone Encounter (Signed)
Covid-19 screening questions  Have you traveled in the last 14 days? No. If yes where?  Do you now or have you had a fever in the last 14 days? No.  Do you have any respiratory symptoms of shortness of breath or cough now or in the last 14 days? No.  Do you have any family members or close contacts with diagnosed or suspected Covid-19 in the past 14 days? No.  Have you been tested for Covid-19 and found to be positive? No.       

## 2019-01-13 ENCOUNTER — Encounter: Payer: Self-pay | Admitting: Internal Medicine

## 2019-01-13 ENCOUNTER — Other Ambulatory Visit: Payer: Self-pay

## 2019-01-13 ENCOUNTER — Ambulatory Visit (AMBULATORY_SURGERY_CENTER): Payer: BC Managed Care – PPO | Admitting: Internal Medicine

## 2019-01-13 VITALS — BP 100/73 | HR 68 | Temp 98.8°F | Resp 15 | Ht 73.0 in | Wt 184.0 lb

## 2019-01-13 DIAGNOSIS — Z86010 Personal history of colonic polyps: Secondary | ICD-10-CM

## 2019-01-13 DIAGNOSIS — D120 Benign neoplasm of cecum: Secondary | ICD-10-CM | POA: Diagnosis not present

## 2019-01-13 DIAGNOSIS — Z1211 Encounter for screening for malignant neoplasm of colon: Secondary | ICD-10-CM | POA: Diagnosis not present

## 2019-01-13 MED ORDER — SODIUM CHLORIDE 0.9 % IV SOLN: 500.0000 mL | Freq: Once | INTRAVENOUS | Status: DC

## 2019-01-13 NOTE — Op Note (Signed)
[facility] Patient Name: Patrick [name] Procedure Date: 01/13/2019 10:46 AM MRN: [id number] Endoscopist: Docia Chuck. Henrene Pastor , MD Age: 63 Referring MD:  Date of Birth: 07-29-1956 Gender: Male Account #: [id number] Procedure:                Colonoscopy with cold snare polypectomy x 1 Indications:              High risk colon cancer surveillance: Personal                            history of non-advanced adenoma, High risk colon                            cancer surveillance: Personal history of sessile                            serrated colon polyp (less than 10 mm in size) with                            no dysplasia. Previous examination March 2014 Medicines:                Monitored Anesthesia Care Procedure:                Pre-Anesthesia Assessment:                           - Prior to the procedure, a History and Physical                            was performed, and patient medications and                            allergies were reviewed. The patient's tolerance of                            previous anesthesia was also reviewed. The risks                            and benefits of the procedure and the sedation                            options and risks were discussed with the patient.                            All questions were answered, and informed consent                            was obtained. Prior Anticoagulants: The patient has                            taken no previous anticoagulant or antiplatelet                            agents. ASA Grade Assessment: I - A normal, healthy  patient. After reviewing the risks and benefits,                            the patient was deemed in satisfactory condition to                            undergo the procedure.                           After obtaining informed consent, the colonoscope                            was passed under direct vision. Throughout the   procedure, the patient's blood pressure, pulse, and                            oxygen saturations were monitored continuously. The                            Colonoscope was introduced through the anus and                            advanced to the the cecum, identified by                            appendiceal orifice and ileocecal valve. The                            ileocecal valve, appendiceal orifice, and rectum                            were photographed. The quality of the bowel                            preparation was excellent. The colonoscopy was                            performed without difficulty. The patient tolerated                            the procedure well. The bowel preparation used was                            SUPREP via split dose instruction. Scope In: 10:57:09 AM Scope Out: 11:10:28 AM Scope Withdrawal Time: 0 hours 10 minutes 30 seconds  Total Procedure Duration: 0 hours 13 minutes 19 seconds  Findings:                 A 1 mm polyp was found in the cecum. The polyp was                            removed with a cold snare. Resection and retrieval  were complete.                           Multiple small and large-mouthed diverticula were                            found in the distal transverse colon and left colon.                           The exam was otherwise without abnormality on                            direct and retroflexion views. Complications:            No immediate complications. Estimated blood loss:                            None. Estimated Blood Loss:     Estimated blood loss: none. Impression:               - One 1 mm polyp in the cecum, removed with a cold                            snare. Resected and retrieved.                           - Diverticulosis in the distal transverse colon and                            in the left colon.                           - The examination was otherwise normal on  direct                            and retroflexion views. Recommendation:           - Repeat colonoscopy in 5 years for surveillance                            (history of multiple polyps).                           - Patient has a contact number available for                            emergencies. The signs and symptoms of potential                            delayed complications were discussed with the                            patient. Return to normal activities tomorrow.                            Written discharge instructions were provided to the  patient.                           - Resume previous diet.                           - Continue present medications.                           - Await pathology results. Docia Chuck. Henrene Pastor, MD 01/13/2019 11:16:41 AM This report has been signed electronically.

## 2019-01-13 NOTE — Progress Notes (Signed)
Called to room to assist during endoscopic procedure.  Patient ID and intended procedure confirmed with present staff. Received instructions for my participation in the procedure from the performing physician.  

## 2019-01-13 NOTE — Progress Notes (Signed)
A and O x3. Report to RN. Tolerated MAC anesthesia well.

## 2019-01-13 NOTE — Patient Instructions (Signed)
YOU HAD AN ENDOSCOPIC PROCEDURE TODAY AT [facility]:   Refer to the procedure report that was given to you for any specific questions about what was found during the examination.  If the procedure report does not answer your questions, please call your gastroenterologist to clarify.  If you requested that your care partner not be given the details of your procedure findings, then the procedure report has been included in a sealed envelope for you to review at your convenience later.  **Handouts given on polyps and diverticulosis**  YOU SHOULD EXPECT: Some feelings of bloating in the abdomen. Passage of more gas than usual.  Walking can help get rid of the air that was put into your GI tract during the procedure and reduce the bloating. If you had a lower endoscopy (such as a colonoscopy or flexible sigmoidoscopy) you may notice spotting of blood in your stool or on the toilet paper. If you underwent a bowel prep for your procedure, you may not have a normal bowel movement for a few days.  Please Note:  You might notice some irritation and congestion in your nose or some drainage.  This is from the oxygen used during your procedure.  There is no need for concern and it should clear up in a day or so.  SYMPTOMS TO REPORT IMMEDIATELY:   Following lower endoscopy (colonoscopy or flexible sigmoidoscopy):  Excessive amounts of blood in the stool  Significant tenderness or worsening of abdominal pains  Swelling of the abdomen that is new, acute  Fever of 100F or higher   For urgent or emergent issues, a gastroenterologist can be reached at any hour by calling [phone].   DIET:  We do recommend a small meal at first, but then you may proceed to your regular diet.  Drink plenty of fluids but you should avoid alcoholic beverages for 24 hours.  ACTIVITY:  You should plan to take it easy for the rest of today and you should NOT DRIVE or use heavy machinery until tomorrow (because  of the sedation medicines used during the test).    FOLLOW UP: Our staff will call the number listed on your records 48-72 hours following your procedure to check on you and address any questions or concerns that you may have regarding the information given to you following your procedure. If we do not reach you, we will leave a message.  We will attempt to reach you two times.  During this call, we will ask if you have developed any symptoms of COVID 19. If you develop any symptoms (ie: fever, flu-like symptoms, shortness of breath, cough etc.) before then, please call [phone].  If you test positive for Covid 19 in the 2 weeks post procedure, please call and report this information to [place].    If any biopsies were taken you will be contacted by phone or by letter within the next 1-3 weeks.  Please call us at [phone] if you have not heard about the biopsies in 3 weeks.    SIGNATURES/CONFIDENTIALITY: You and/or your care partner have signed paperwork which will be entered into your electronic medical record.  These signatures attest to the fact that that the information above on your After Visit Summary has been reviewed and is understood.  Full responsibility of the confidentiality of this discharge information lies with you and/or your care-partner.

## 2019-01-13 NOTE — Progress Notes (Signed)
Pt's states no medical or surgical changes since previsit or office visit. 

## 2019-01-15 ENCOUNTER — Telehealth: Payer: Self-pay | Admitting: *Deleted

## 2019-01-15 ENCOUNTER — Telehealth: Payer: Self-pay

## 2019-01-15 NOTE — Telephone Encounter (Signed)
Attempted to reach patient for post-procedure f/u call. No answer. Left message for him to please call us if he has questions/concerns regarding his care.

## 2019-01-15 NOTE — Telephone Encounter (Signed)
No answer. Number identifier. Message left to call if questions or concerns and we will try to reach out a little later in the day.

## 2019-01-17 ENCOUNTER — Encounter: Payer: Self-pay | Admitting: Internal Medicine

## 2019-01-19 ENCOUNTER — Encounter (HOSPITAL_COMMUNITY): Payer: Self-pay

## 2019-01-19 NOTE — Patient Instructions (Addendum)
DUE TO COVID-19 NO VISITORS ARE ALLOWED IN THE HOSPITAL AT THIS TIME   COVID SWAB TESTING MUST BE COMPLETED ON: Saturday 01/23/2019 @   0915am            (Must self quarantine after testing. Follow instructions on handout.)   Your procedure is scheduled on: Wednesday, January 27, 2019     Report to [facility] Main  Entrance   Report to Short Stay at 6:30 AM   Call this number if you have problems the morning of surgery [phone]   Do not eat food:After Midnight.   May have liquids until 4:30AM day of surgery   CLEAR LIQUID DIET  Foods Allowed                                                                     Foods Excluded  Water, Black Coffee and tea, regular and decaf                             liquids that you cannot  Plain Jell-O in any flavor                                             see through such as: Fruit ices (not with fruit pulp)                                     milk, soups, orange juice  Iced Popsicles                                    All solid food Carbonated beverages, regular and diet                                    Cranberry, grape and apple juices Sports drinks like Gatorade Lightly seasoned clear broth or consume(fat free) Sugar, honey syrup  Sample Menu Breakfast                                Lunch                                     Supper Cranberry juice                    Beef broth                            Chicken broth Jell-O                                     Grape juice  Apple juice Coffee or tea                        Jell-O                                      Popsicle                                                Coffee or tea                        Coffee or tea   Complete one Ensure drink the morning of surgery at 4:30AM the day of surgery.   Brush your teeth the morning of surgery.   Do NOT smoke after Midnight   Take these medicines the morning of surgery with A SIP OF WATER:  Omeprazole                               You may not have any metal on your body including jewelry, and body piercings             Do not wear lotions, powders, perfumes/cologne, or deodorant                          Men may shave face and neck.   Do not bring valuables to the hospital. [place].   Contacts, dentures or bridgework may not be worn into surgery.   Bring small overnight bag day of surgery.    Special Instructions: Bring a copy of your healthcare power of attorney and living will documents         the day of surgery if you haven't scanned them in before.              Please read over the following fact sheets you were given:  [facility] - Preparing for Surgery Before surgery, you can play an important role.  Because skin is not sterile, your skin needs to be as free of germs as possible.  You can reduce the number of germs on your skin by washing with CHG (chlorahexidine gluconate) soap before surgery.  CHG is an antiseptic cleaner which kills germs and bonds with the skin to continue killing germs even after washing. Please DO NOT use if you have an allergy to CHG or antibacterial soaps.  If your skin becomes reddened/irritated stop using the CHG and inform your nurse when you arrive at Short Stay. Do not shave (including legs and underarms) for at least 48 hours prior to the first CHG shower.  You may shave your face/neck.  Please follow these instructions carefully:  1.  Shower with CHG Soap the night before surgery and the  morning of surgery.  2.  If you choose to wash your hair, wash your hair first as usual with your normal  shampoo.  3.  After you shampoo, rinse your hair and body thoroughly to remove the shampoo.  4.  Use CHG as you would any other liquid soap.  You can apply chg directly to the skin and wash.  Gently with a scrungie or clean washcloth.  5.  Apply the CHG Soap to your body  ONLY FROM THE NECK DOWN.   Do   not use on face/ open                           Wound or open sores. Avoid contact with eyes, ears mouth and   genitals (private parts).                       Wash face,  Genitals (private parts) with your normal soap.             6.  Wash thoroughly, paying special attention to the area where your    surgery  will be performed.  7.  Thoroughly rinse your body with warm water from the neck down.  8.  DO NOT shower/wash with your normal soap after using and rinsing off the CHG Soap.                9.  Pat yourself dry with a clean towel.            10.  Wear clean pajamas.            11.  Place clean sheets on your bed the night of your first shower and do not  sleep with pets. Day of Surgery : Do not apply any lotions/deodorants the morning of surgery.  Please wear clean clothes to the hospital/surgery center.  FAILURE TO FOLLOW THESE INSTRUCTIONS MAY RESULT IN THE CANCELLATION OF YOUR SURGERY  PATIENT SIGNATURE_________________________________  NURSE SIGNATURE__________________________________  ________________________________________________________________________   Adam Phenix  An incentive spirometer is a tool that can help keep your lungs clear and active. This tool measures how well you are filling your lungs with each breath. Taking long deep breaths may help reverse or decrease the chance of developing breathing (pulmonary) problems (especially infection) following:  A long period of time when you are unable to move or be active. BEFORE THE PROCEDURE   If the spirometer includes an indicator to show your best effort, your nurse or respiratory therapist will set it to a desired goal.  If possible, sit up straight or lean slightly forward. Try not to slouch.  Hold the incentive spirometer in an upright position. INSTRUCTIONS FOR USE  1. Sit on the edge of your bed if possible, or sit up as far as you can in bed or on a chair. 2. Hold  the incentive spirometer in an upright position. 3. Breathe out normally. 4. Place the mouthpiece in your mouth and seal your lips tightly around it. 5. Breathe in slowly and as deeply as possible, raising the piston or the ball toward the top of the column. 6. Hold your breath for 3-5 seconds or for as long as possible. Allow the piston or ball to fall to the bottom of the column. 7. Remove the mouthpiece from your mouth and breathe out normally. 8. Rest for a few seconds and repeat Steps 1 through 7 at least 10 times every 1-2 hours when you are awake. Take your time and take a few normal breaths between deep breaths. 9. The spirometer may include an indicator to show your best effort. Use the indicator as a goal to work toward during each  repetition. 10. After each set of 10 deep breaths, practice coughing to be sure your lungs are clear. If you have an incision (the cut made at the time of surgery), support your incision when coughing by placing a pillow or rolled up towels firmly against it. Once you are able to get out of bed, walk around indoors and cough well. You may stop using the incentive spirometer when instructed by your caregiver.  RISKS AND COMPLICATIONS  Take your time so you do not get dizzy or light-headed.  If you are in pain, you may need to take or ask for pain medication before doing incentive spirometry. It is harder to take a deep breath if you are having pain. AFTER USE  Rest and breathe slowly and easily.  It can be helpful to keep track of a log of your progress. Your caregiver can provide you with a simple table to help with this. If you are using the spirometer at home, follow these instructions: [place] IF:   You are having difficultly using the spirometer.  You have trouble using the spirometer as often as instructed.  Your pain medication is not giving enough relief while using the spirometer.  You develop fever of 100.5 F (38.1 C) or  higher. SEEK IMMEDIATE MEDICAL CARE IF:   You cough up bloody sputum that had not been present before.  You develop fever of 102 F (38.9 C) or greater.  You develop worsening pain at or near the incision site. MAKE SURE YOU:   Understand these instructions.  Will watch your condition.  Will get help right away if you are not doing well or get worse. Document Released: 12/09/2006 Document Revised: 10/21/2011 Document Reviewed: 02/09/2007 [facility] Patient Information 2014 [place], [place].   ________________________________________________________________________

## 2019-01-20 ENCOUNTER — Encounter (HOSPITAL_COMMUNITY)
Admission: RE | Admit: 2019-01-20 | Discharge: 2019-01-20 | Disposition: A | Discharge status: Home / Self Care | Payer: BC Managed Care – PPO | Source: Ambulatory Visit | Attending: Orthopaedic Surgery | Admitting: Orthopaedic Surgery

## 2019-01-20 ENCOUNTER — Other Ambulatory Visit: Payer: Self-pay

## 2019-01-20 ENCOUNTER — Encounter (HOSPITAL_COMMUNITY): Payer: Self-pay

## 2019-01-20 DIAGNOSIS — M19012 Primary osteoarthritis, left shoulder: Secondary | ICD-10-CM | POA: Insufficient documentation

## 2019-01-20 DIAGNOSIS — Z01812 Encounter for preprocedural laboratory examination: Secondary | ICD-10-CM | POA: Insufficient documentation

## 2019-01-20 HISTORY — DX: Personal history of colonic polyps: Z86.010

## 2019-01-20 HISTORY — DX: Enthesopathy, unspecified: M77.9

## 2019-01-20 HISTORY — DX: Plantar fascial fibromatosis: M72.2

## 2019-01-20 HISTORY — DX: Personal history of colon polyps, unspecified: Z86.0100

## 2019-01-20 HISTORY — DX: Diverticulosis of intestine, part unspecified, without perforation or abscess without bleeding: K57.90

## 2019-01-20 HISTORY — DX: Personal history of other diseases of the digestive system: Z87.19

## 2019-01-20 HISTORY — DX: Benign prostatic hyperplasia without lower urinary tract symptoms: N40.0

## 2019-01-20 HISTORY — DX: Basal cell carcinoma of skin, unspecified: C44.91

## 2019-01-20 LAB — BASIC METABOLIC PANEL
Anion gap: 7 (ref 5–15)
BUN: 27 mg/dL — ABNORMAL HIGH (ref 6–20)
CO2: 26 mmol/L (ref 22–32)
Calcium: 9.1 mg/dL (ref 8.9–10.3)
Chloride: 108 mmol/L (ref 98–111)
Creatinine, Ser: 0.81 mg/dL (ref 0.61–1.24)
GFR calc Af Amer: >60 mL/min (ref >60)
GFR calc non Af Amer: >60 mL/min (ref >60)
Glucose, Bld: 97 mg/dL (ref 70–99)
Potassium: 4.3 mmol/L (ref 3.5–5.1)
Sodium: 141 mmol/L (ref 135–145)

## 2019-01-20 LAB — CBC
HCT: 46.7 % (ref 39.0–52.0)
Hemoglobin: 15.4 g/dL (ref 13.0–17.0)
MCH: 31.3 pg (ref 26.0–34.0)
MCHC: 33 g/dL (ref 30.0–36.0)
MCV: 94.9 fL (ref 80.0–100.0)
Platelets: 236 10*3/uL (ref 150–400)
RBC: 4.92 MIL/uL (ref 4.22–5.81)
RDW: 12.3 % (ref 11.5–15.5)
WBC: 5.5 10*3/uL (ref 4.0–10.5)
nRBC: 0 % (ref 0.0–0.2)

## 2019-01-20 LAB — SURGICAL PCR SCREEN
MRSA, PCR: NEGATIVE
Staphylococcus aureus: NEGATIVE

## 2019-01-23 ENCOUNTER — Other Ambulatory Visit (HOSPITAL_COMMUNITY)
Admission: RE | Admit: 2019-01-23 | Discharge: 2019-01-23 | Disposition: A | Discharge status: Home / Self Care | Payer: BC Managed Care – PPO | Source: Ambulatory Visit | Attending: Orthopaedic Surgery | Admitting: Orthopaedic Surgery

## 2019-01-23 DIAGNOSIS — Z1159 Encounter for screening for other viral diseases: Secondary | ICD-10-CM | POA: Diagnosis not present

## 2019-01-25 LAB — NOVEL CORONAVIRUS, NAA (HOSP ORDER, SEND-OUT TO REF LAB; TAT 18-24 HRS): SARS-CoV-2, NAA: NOT DETECTED

## 2019-01-26 NOTE — Progress Notes (Signed)
SPOKE W/  _ Patrick [name] (pt states has quarantined since having covid 19 test preformed)     SCREENING SYMPTOMS OF COVID 19:   COUGH--NO  RUNNY NOSE---NO  SORE THROAT---NO  NASAL CONGESTION----NO  SNEEZING----NO  SHORTNESS OF BREATH---NO  DIFFICULTY BREATHING---NO  TEMP >100.0 -----NO  UNEXPLAINED BODY ACHES------NO  CHILLS --------NO  HEADACHES ---------NO  LOSS OF SMELL/ TASTE --------NO    HAVE YOU OR ANY FAMILY MEMBER TRAVELLED PAST 14 DAYS OUT OF THE   COUNTY---NO STATE----NO COUNTRY----NO  HAVE YOU OR ANY FAMILY MEMBER BEEN EXPOSED TO ANYONE WITH COVID 19? NO

## 2019-01-26 NOTE — Anesthesia Preprocedure Evaluation (Addendum)
Anesthesia Evaluation  Patient identified by MRN, date of birth, ID band Patient awake    Reviewed: Allergy & Precautions, H&P , NPO status , Patient's Chart, lab work & pertinent test results  Airway Mallampati: II  TM Distance: >3 FB Neck ROM: Full    Dental no notable dental hx. (+) Teeth Intact, Dental Advisory Given   Pulmonary neg pulmonary ROS,    Pulmonary exam normal breath sounds clear to auscultation       Cardiovascular Exercise Tolerance: Good negative cardio ROS   Rhythm:Regular Rate:Normal     Neuro/Psych negative neurological ROS  negative psych ROS   GI/Hepatic Neg liver ROS, GERD  Medicated and Controlled,  Endo/Other  negative endocrine ROS  Renal/GU negative Renal ROS  negative genitourinary   Musculoskeletal  (+) Arthritis , Osteoarthritis,    Abdominal   Peds  Hematology negative hematology ROS (+)   Anesthesia Other Findings   Reproductive/Obstetrics negative OB ROS                            Anesthesia Physical Anesthesia Plan  ASA: II  Anesthesia Plan: General   Post-op Pain Management:  Regional for Post-op pain   Induction: Intravenous  PONV Risk Score and Plan: 2 and Ondansetron, Dexamethasone and Midazolam  Airway Management Planned: Oral ETT  Additional Equipment:   Intra-op Plan:   Post-operative Plan: Extubation in OR  Informed Consent: I have reviewed the patients History and Physical, chart, labs and discussed the procedure including the risks, benefits and alternatives for the proposed anesthesia with the patient or authorized representative who has indicated his/her understanding and acceptance.     Dental advisory given  Plan Discussed with: CRNA  Anesthesia Plan Comments:        Anesthesia Quick Evaluation

## 2019-01-26 NOTE — Progress Notes (Signed)
SPOKE W/  Voicemail left for patient to return call     SCREENING SYMPTOMS OF COVID 19:   COUGH--  RUNNY NOSE---   SORE THROAT---  NASAL CONGESTION----  SNEEZING----  SHORTNESS OF BREATH---  DIFFICULTY BREATHING---  TEMP >100.0 -----  UNEXPLAINED BODY ACHES------  CHILLS --------   HEADACHES ---------  LOSS OF SMELL/ TASTE --------    HAVE YOU OR ANY FAMILY MEMBER TRAVELLED PAST 14 DAYS OUT OF THE   COUNTY--- STATE---- COUNTRY----  HAVE YOU OR ANY FAMILY MEMBER BEEN EXPOSED TO ANYONE WITH COVID 19?     

## 2019-01-27 ENCOUNTER — Encounter (HOSPITAL_COMMUNITY): Payer: Self-pay | Admitting: Emergency Medicine

## 2019-01-27 ENCOUNTER — Inpatient Hospital Stay (HOSPITAL_COMMUNITY): Payer: BC Managed Care – PPO | Admitting: Certified Registered"

## 2019-01-27 ENCOUNTER — Encounter (HOSPITAL_COMMUNITY)
Admission: RE | Disposition: A | Discharge status: Home / Self Care | Payer: Self-pay | Source: Home / Self Care | Attending: Orthopaedic Surgery

## 2019-01-27 ENCOUNTER — Inpatient Hospital Stay (HOSPITAL_COMMUNITY)
Admission: RE | Admit: 2019-01-27 | Discharge: 2019-01-28 | DRG: 483 | Disposition: A | Discharge status: Home / Self Care | Payer: BC Managed Care – PPO | Attending: Orthopaedic Surgery | Admitting: Orthopaedic Surgery

## 2019-01-27 ENCOUNTER — Inpatient Hospital Stay (HOSPITAL_COMMUNITY): Payer: BC Managed Care – PPO | Admitting: Physician Assistant

## 2019-01-27 ENCOUNTER — Other Ambulatory Visit: Payer: Self-pay

## 2019-01-27 ENCOUNTER — Inpatient Hospital Stay (HOSPITAL_COMMUNITY): Payer: BC Managed Care – PPO

## 2019-01-27 DIAGNOSIS — M549 Dorsalgia, unspecified: Secondary | ICD-10-CM | POA: Diagnosis not present

## 2019-01-27 DIAGNOSIS — M19012 Primary osteoarthritis, left shoulder: Secondary | ICD-10-CM | POA: Diagnosis not present

## 2019-01-27 DIAGNOSIS — N400 Enlarged prostate without lower urinary tract symptoms: Secondary | ICD-10-CM | POA: Diagnosis present

## 2019-01-27 DIAGNOSIS — G8918 Other acute postprocedural pain: Secondary | ICD-10-CM | POA: Diagnosis not present

## 2019-01-27 DIAGNOSIS — Y798 Miscellaneous orthopedic devices associated with adverse incidents, not elsewhere classified: Secondary | ICD-10-CM | POA: Diagnosis present

## 2019-01-27 DIAGNOSIS — M659 Synovitis and tenosynovitis, unspecified: Secondary | ICD-10-CM | POA: Diagnosis not present

## 2019-01-27 DIAGNOSIS — Z79899 Other long term (current) drug therapy: Secondary | ICD-10-CM

## 2019-01-27 DIAGNOSIS — T84121A Displacement of internal fixation device of left humerus, initial encounter: Secondary | ICD-10-CM | POA: Diagnosis not present

## 2019-01-27 DIAGNOSIS — Z96612 Presence of left artificial shoulder joint: Secondary | ICD-10-CM | POA: Diagnosis not present

## 2019-01-27 DIAGNOSIS — Z471 Aftercare following joint replacement surgery: Secondary | ICD-10-CM | POA: Diagnosis not present

## 2019-01-27 DIAGNOSIS — Z880 Allergy status to penicillin: Secondary | ICD-10-CM | POA: Diagnosis not present

## 2019-01-27 DIAGNOSIS — K219 Gastro-esophageal reflux disease without esophagitis: Secondary | ICD-10-CM | POA: Diagnosis present

## 2019-01-27 DIAGNOSIS — Z85828 Personal history of other malignant neoplasm of skin: Secondary | ICD-10-CM | POA: Diagnosis not present

## 2019-01-27 DIAGNOSIS — G8929 Other chronic pain: Secondary | ICD-10-CM | POA: Diagnosis present

## 2019-01-27 DIAGNOSIS — Z09 Encounter for follow-up examination after completed treatment for conditions other than malignant neoplasm: Secondary | ICD-10-CM

## 2019-01-27 DIAGNOSIS — M24012 Loose body in left shoulder: Secondary | ICD-10-CM | POA: Diagnosis present

## 2019-01-27 DIAGNOSIS — M25712 Osteophyte, left shoulder: Secondary | ICD-10-CM | POA: Diagnosis present

## 2019-01-27 DIAGNOSIS — Z87892 Personal history of anaphylaxis: Secondary | ICD-10-CM

## 2019-01-27 DIAGNOSIS — T8484XA Pain due to internal orthopedic prosthetic devices, implants and grafts, initial encounter: Secondary | ICD-10-CM | POA: Diagnosis not present

## 2019-01-27 DIAGNOSIS — M75102 Unspecified rotator cuff tear or rupture of left shoulder, not specified as traumatic: Secondary | ICD-10-CM | POA: Diagnosis not present

## 2019-01-27 HISTORY — PX: REVERSE SHOULDER ARTHROPLASTY: SHX5054

## 2019-01-27 SURGERY — ARTHROPLASTY, SHOULDER, TOTAL, REVERSE
Anesthesia: General | Laterality: Left

## 2019-01-27 MED ORDER — SODIUM CHLORIDE 0.9 % IV SOLN
INTRAVENOUS | Status: DC | PRN
  Administered 2019-01-27: 20 ug/min via INTRAVENOUS

## 2019-01-27 MED ORDER — ACETAMINOPHEN 500 MG PO TABS
1000.0000 mg | ORAL_TABLET | Freq: Once | ORAL | Status: AC
  Administered 2019-01-27: 1000 mg via ORAL
  Filled 2019-01-27: qty 2

## 2019-01-27 MED ORDER — VANCOMYCIN HCL 1000 MG IV SOLR
INTRAVENOUS | Status: AC
  Filled 2019-01-27: qty 1000

## 2019-01-27 MED ORDER — SODIUM CHLORIDE 0.9 % IR SOLN
Status: DC | PRN
  Administered 2019-01-27 (×2): 1000 mL

## 2019-01-27 MED ORDER — LACTATED RINGERS IV SOLN
INTRAVENOUS | Status: DC
  Administered 2019-01-27: 07:00:00 via INTRAVENOUS

## 2019-01-27 MED ORDER — DOCUSATE SODIUM 100 MG PO CAPS
100.0000 mg | ORAL_CAPSULE | Freq: Two times a day (BID) | ORAL | Status: DC
  Administered 2019-01-27 – 2019-01-28 (×2): 100 mg via ORAL
  Filled 2019-01-27 (×2): qty 1

## 2019-01-27 MED ORDER — TRANEXAMIC ACID-NACL 1000-0.7 MG/100ML-% IV SOLN
1000.0000 mg | INTRAVENOUS | Status: AC
  Administered 2019-01-27: 1000 mg via INTRAVENOUS
  Filled 2019-01-27 (×2): qty 100

## 2019-01-27 MED ORDER — PROPOFOL 10 MG/ML IV BOLUS
INTRAVENOUS | Status: DC | PRN
  Administered 2019-01-27: 160 mg via INTRAVENOUS

## 2019-01-27 MED ORDER — BUPIVACAINE LIPOSOME 1.3 % IJ SUSP
INTRAMUSCULAR | Status: DC | PRN
  Administered 2019-01-27: 10 mL via PERINEURAL

## 2019-01-27 MED ORDER — SUCCINYLCHOLINE CHLORIDE 200 MG/10ML IV SOSY
PREFILLED_SYRINGE | INTRAVENOUS | Status: AC
  Filled 2019-01-27: qty 10

## 2019-01-27 MED ORDER — LIDOCAINE 2% (20 MG/ML) 5 ML SYRINGE
INTRAMUSCULAR | Status: DC | PRN
  Administered 2019-01-27: 40 mg via INTRAVENOUS

## 2019-01-27 MED ORDER — TOBRAMYCIN SULFATE 1.2 G IJ SOLR
INTRAMUSCULAR | Status: DC | PRN
  Administered 2019-01-27: 1.2 g via TOPICAL

## 2019-01-27 MED ORDER — VANCOMYCIN HCL IN DEXTROSE 1-5 GM/200ML-% IV SOLN
1000.0000 mg | INTRAVENOUS | Status: AC
  Administered 2019-01-27: 1000 mg via INTRAVENOUS
  Filled 2019-01-27: qty 200

## 2019-01-27 MED ORDER — ONDANSETRON HCL 4 MG/2ML IJ SOLN
INTRAMUSCULAR | Status: DC | PRN
  Administered 2019-01-27: 4 mg via INTRAVENOUS

## 2019-01-27 MED ORDER — FENTANYL CITRATE (PF) 250 MCG/5ML IJ SOLN
INTRAMUSCULAR | Status: DC | PRN
  Administered 2019-01-27 (×3): 50 ug via INTRAVENOUS

## 2019-01-27 MED ORDER — ONDANSETRON HCL 4 MG/2ML IJ SOLN
INTRAMUSCULAR | Status: AC
  Filled 2019-01-27: qty 2

## 2019-01-27 MED ORDER — TOBRAMYCIN SULFATE 1.2 G IJ SOLR
INTRAMUSCULAR | Status: AC
  Filled 2019-01-27: qty 1.2

## 2019-01-27 MED ORDER — ONDANSETRON HCL 4 MG PO TABS: 4.0000 mg | ORAL_TABLET | Freq: Four times a day (QID) | ORAL | Status: DC | PRN

## 2019-01-27 MED ORDER — HYDROMORPHONE HCL 1 MG/ML IJ SOLN: 0.2500 mg | INTRAMUSCULAR | Status: DC | PRN

## 2019-01-27 MED ORDER — ROCURONIUM BROMIDE 10 MG/ML (PF) SYRINGE
PREFILLED_SYRINGE | INTRAVENOUS | Status: AC
  Filled 2019-01-27: qty 10

## 2019-01-27 MED ORDER — ZOLPIDEM TARTRATE 5 MG PO TABS: 5.0000 mg | ORAL_TABLET | Freq: Every evening | ORAL | Status: DC | PRN

## 2019-01-27 MED ORDER — FENTANYL CITRATE (PF) 100 MCG/2ML IJ SOLN
INTRAMUSCULAR | Status: AC
  Filled 2019-01-27: qty 2

## 2019-01-27 MED ORDER — CHLORHEXIDINE GLUCONATE 4 % EX LIQD: 60.0000 mL | Freq: Once | CUTANEOUS | Status: DC

## 2019-01-27 MED ORDER — SUGAMMADEX SODIUM 200 MG/2ML IV SOLN
INTRAVENOUS | Status: DC | PRN
  Administered 2019-01-27: 180 mg via INTRAVENOUS

## 2019-01-27 MED ORDER — ROCURONIUM BROMIDE 10 MG/ML (PF) SYRINGE
PREFILLED_SYRINGE | INTRAVENOUS | Status: DC | PRN
  Administered 2019-01-27: 20 mg via INTRAVENOUS
  Administered 2019-01-27: 50 mg via INTRAVENOUS
  Administered 2019-01-27 (×4): 10 mg via INTRAVENOUS
  Administered 2019-01-27: 20 mg via INTRAVENOUS

## 2019-01-27 MED ORDER — PHENYLEPHRINE 40 MCG/ML (10ML) SYRINGE FOR IV PUSH (FOR BLOOD PRESSURE SUPPORT)
PREFILLED_SYRINGE | INTRAVENOUS | Status: DC | PRN
  Administered 2019-01-27 (×2): 120 ug via INTRAVENOUS

## 2019-01-27 MED ORDER — MIDAZOLAM HCL 2 MG/2ML IJ SOLN
INTRAMUSCULAR | Status: DC | PRN
  Administered 2019-01-27: 2 mg via INTRAVENOUS

## 2019-01-27 MED ORDER — VANCOMYCIN HCL POWD
Status: DC | PRN
  Administered 2019-01-27: 1000 mg via TOPICAL

## 2019-01-27 MED ORDER — PHENYLEPHRINE HCL (PRESSORS) 10 MG/ML IV SOLN
INTRAVENOUS | Status: AC
  Filled 2019-01-27: qty 1

## 2019-01-27 MED ORDER — VANCOMYCIN HCL 1000 MG IV SOLR
1000.0000 mg | Freq: Two times a day (BID) | INTRAVENOUS | Status: AC
  Administered 2019-01-27: 1000 mg via INTRAVENOUS
  Filled 2019-01-27: qty 1000

## 2019-01-27 MED ORDER — VANCOMYCIN HCL IN DEXTROSE 1-5 GM/200ML-% IV SOLN
1000.0000 mg | Freq: Two times a day (BID) | INTRAVENOUS | Status: DC
  Filled 2019-01-27: qty 200

## 2019-01-27 MED ORDER — DIPHENHYDRAMINE HCL 12.5 MG/5ML PO ELIX: 12.5000 mg | ORAL_SOLUTION | ORAL | Status: DC | PRN

## 2019-01-27 MED ORDER — HYDROMORPHONE HCL 1 MG/ML IJ SOLN: 0.5000 mg | INTRAMUSCULAR | Status: DC | PRN

## 2019-01-27 MED ORDER — SUCCINYLCHOLINE CHLORIDE 200 MG/10ML IV SOSY
PREFILLED_SYRINGE | INTRAVENOUS | Status: DC | PRN
  Administered 2019-01-27: 120 mg via INTRAVENOUS

## 2019-01-27 MED ORDER — ONDANSETRON HCL 4 MG/2ML IJ SOLN: 4.0000 mg | Freq: Four times a day (QID) | INTRAMUSCULAR | Status: DC | PRN

## 2019-01-27 MED ORDER — LIDOCAINE 2% (20 MG/ML) 5 ML SYRINGE
INTRAMUSCULAR | Status: AC
  Filled 2019-01-27: qty 5

## 2019-01-27 MED ORDER — OXYCODONE HCL 5 MG PO TABS: 5.0000 mg | ORAL_TABLET | ORAL | Status: DC | PRN

## 2019-01-27 MED ORDER — DEXAMETHASONE SODIUM PHOSPHATE 10 MG/ML IJ SOLN
INTRAMUSCULAR | Status: AC
  Filled 2019-01-27: qty 1

## 2019-01-27 MED ORDER — 0.9 % SODIUM CHLORIDE (POUR BTL) OPTIME
TOPICAL | Status: DC | PRN
  Administered 2019-01-27: 1000 mL

## 2019-01-27 MED ORDER — SUGAMMADEX SODIUM 200 MG/2ML IV SOLN
INTRAVENOUS | Status: AC
  Filled 2019-01-27: qty 2

## 2019-01-27 MED ORDER — PHENYLEPHRINE 40 MCG/ML (10ML) SYRINGE FOR IV PUSH (FOR BLOOD PRESSURE SUPPORT)
PREFILLED_SYRINGE | INTRAVENOUS | Status: AC
  Filled 2019-01-27: qty 10

## 2019-01-27 MED ORDER — ACETAMINOPHEN 500 MG PO TABS
1000.0000 mg | ORAL_TABLET | Freq: Three times a day (TID) | ORAL | Status: DC
  Administered 2019-01-27 – 2019-01-28 (×3): 1000 mg via ORAL
  Filled 2019-01-27 (×3): qty 2

## 2019-01-27 MED ORDER — METOCLOPRAMIDE HCL 5 MG PO TABS: 5.0000 mg | ORAL_TABLET | Freq: Three times a day (TID) | ORAL | Status: DC | PRN

## 2019-01-27 MED ORDER — DEXAMETHASONE SODIUM PHOSPHATE 10 MG/ML IJ SOLN
INTRAMUSCULAR | Status: DC | PRN
  Administered 2019-01-27: 8 mg via INTRAVENOUS

## 2019-01-27 MED ORDER — MIDAZOLAM HCL 2 MG/2ML IJ SOLN
INTRAMUSCULAR | Status: AC
  Filled 2019-01-27: qty 2

## 2019-01-27 MED ORDER — METOCLOPRAMIDE HCL 5 MG/ML IJ SOLN: 5.0000 mg | Freq: Three times a day (TID) | INTRAMUSCULAR | Status: DC | PRN

## 2019-01-27 MED ORDER — BUPIVACAINE-EPINEPHRINE (PF) 0.5% -1:200000 IJ SOLN
INTRAMUSCULAR | Status: DC | PRN
  Administered 2019-01-27: 15 mL via PERINEURAL

## 2019-01-27 MED ORDER — BUPIVACAINE-EPINEPHRINE (PF) 0.25% -1:200000 IJ SOLN
INTRAMUSCULAR | Status: AC
  Filled 2019-01-27: qty 30

## 2019-01-27 SURGICAL SUPPLY — 71 items
AEQUALIS PERFORM+REVERSED LATERALIZED BASEPLATE (Orthopedic Implant) ×1 IMPLANT
AID PSTN UNV HD RSTRNT DISP (MISCELLANEOUS) ×1
BIT DRILL 3.2 PERIPHERAL SCREW (BIT) ×1 IMPLANT
BLADE EXTENDED COATED 6.5IN (ELECTRODE) IMPLANT
BLADE SAW SAG 73X25 THK (BLADE) ×1
BLADE SAW SGTL 73X25 THK (BLADE) ×1 IMPLANT
BSPLAT GLND +3 29 (Joint) ×1 IMPLANT
CLSR STERI-STRIP ANTIMIC 1/2X4 (GAUZE/BANDAGES/DRESSINGS) ×2 IMPLANT
CONT SPECI 4OZ STER CLIK (MISCELLANEOUS) ×3 IMPLANT
COVER SURGICAL LIGHT HANDLE (MISCELLANEOUS) ×2 IMPLANT
COVER WAND RF STERILE (DRAPES) ×2 IMPLANT
DRAPE INCISE IOBAN 66X45 STRL (DRAPES) ×2 IMPLANT
DRAPE ORTHO SPLIT 77X108 STRL (DRAPES) ×4
DRAPE SHEET LG 3/4 BI-LAMINATE (DRAPES) ×2 IMPLANT
DRAPE SURG ORHT 6 SPLT 77X108 (DRAPES) ×2 IMPLANT
DRSG AQUACEL AG ADV 3.5X 6 (GAUZE/BANDAGES/DRESSINGS) ×2 IMPLANT
DURAPREP 26ML APPLICATOR (WOUND CARE) ×4 IMPLANT
ELECT BLADE TIP CTD 4 INCH (ELECTRODE) ×2 IMPLANT
ELECT REM PT RETURN 15FT ADLT (MISCELLANEOUS) ×2 IMPLANT
GLENOID BASEPLATE 29 +3 (Joint) ×1 IMPLANT
GLENOSPHERE STANDARD 39 (Joint) ×2 IMPLANT
GLENOSPHERE STD 39 (Joint) IMPLANT
GLOVE BIO SURGEON STRL SZ8 (GLOVE) ×2 IMPLANT
GLOVE BIOGEL PI IND STRL 8 (GLOVE) ×2 IMPLANT
GLOVE BIOGEL PI INDICATOR 8 (GLOVE) ×2
GLOVE ECLIPSE 8.0 STRL XLNG CF (GLOVE) ×4 IMPLANT
GOWN SPEC L3 XXLG W/TWL (GOWN DISPOSABLE) ×2 IMPLANT
GOWN STRL REUS W/ TWL XL LVL3 (GOWN DISPOSABLE) ×1 IMPLANT
GOWN STRL REUS W/TWL XL LVL3 (GOWN DISPOSABLE) ×2
GUIDE PIN 3X75 SHOULDER (Miscellaneous) ×2 IMPLANT
GUIDEWIRE GLENOID 2.5X220 (WIRE) ×1 IMPLANT
HANDPIECE INTERPULSE COAX TIP (DISPOSABLE) ×2
HEMOSTAT SURGICEL 2X14 (HEMOSTASIS) IMPLANT
IMPL REVERSE SHOULDER 0X3.5 (Shoulder) IMPLANT
IMPLANT REVERSE SHOULDER 0X3.5 (Shoulder) ×2 IMPLANT
INSERT REV KIT SHOULDER 6X39 (Screw) ×1 IMPLANT
KIT BASIN OR (CUSTOM PROCEDURE TRAY) ×2 IMPLANT
KIT STABILIZATION SHOULDER (MISCELLANEOUS) ×2 IMPLANT
KIT TURNOVER KIT A (KITS) IMPLANT
MANIFOLD NEPTUNE II (INSTRUMENTS) ×2 IMPLANT
NDL HYPO 25X1 1.5 SAFETY (NEEDLE) IMPLANT
NDL MAYO CATGUT SZ4 TPR NDL (NEEDLE) IMPLANT
NEEDLE HYPO 25X1 1.5 SAFETY (NEEDLE) IMPLANT
NEEDLE MAYO CATGUT SZ4 (NEEDLE) IMPLANT
NS IRRIG 1000ML POUR BTL (IV SOLUTION) ×2 IMPLANT
PACK SHOULDER (CUSTOM PROCEDURE TRAY) ×2 IMPLANT
PIN GUIDE 3X75 SHOULDER (Miscellaneous) IMPLANT
RESTRAINT HEAD UNIVERSAL NS (MISCELLANEOUS) ×2 IMPLANT
SCREW 5.0X38 SMALL F/PERFORM (Screw) ×1 IMPLANT
SCREW 5.5X26 (Screw) ×1 IMPLANT
SCREW BONE THREAD 6.5X35 (Screw) ×1 IMPLANT
SET HNDPC FAN SPRY TIP SCT (DISPOSABLE) ×1 IMPLANT
SLING ULTRA III MED (ORTHOPEDIC SUPPLIES) ×3 IMPLANT
SPONGE LAP 18X18 RF (DISPOSABLE) ×1 IMPLANT
SPONGE LAP 18X18 X RAY DECT (DISPOSABLE) IMPLANT
STEM HUMERAL SZ2B STND 70 PTC (Stem) ×2 IMPLANT
STEM HUMERAL SZ2BSTD 70 PTC (Stem) IMPLANT
SUCTION FRAZIER HANDLE 10FR (MISCELLANEOUS) ×1
SUCTION FRAZIER HANDLE 12FR (TUBING) ×1
SUCTION TUBE FRAZIER 10FR DISP (MISCELLANEOUS) ×1 IMPLANT
SUCTION TUBE FRAZIER 12FR DISP (TUBING) IMPLANT
SUT ETHIBOND 2 V 37 (SUTURE) ×2 IMPLANT
SUT ETHIBOND NAB CT1 #1 30IN (SUTURE) ×2 IMPLANT
SUT FIBERWIRE #5 38 CONV NDL (SUTURE) ×8
SUT MON AB 3-0 SH 27 (SUTURE) ×2
SUT MON AB 3-0 SH27 (SUTURE) ×1 IMPLANT
SUT VIC AB 0 CT1 36 (SUTURE) ×3 IMPLANT
SUTURE FIBERWR #5 38 CONV NDL (SUTURE) ×4 IMPLANT
TOWEL OR 17X26 10 PK STRL BLUE (TOWEL DISPOSABLE) ×2 IMPLANT
TUBING CONNECTING 10 (TUBING) ×1 IMPLANT
WATER STERILE IRR 1000ML POUR (IV SOLUTION) ×4 IMPLANT

## 2019-01-27 NOTE — Anesthesia Procedure Notes (Signed)
Anesthesia Regional Block: Interscalene brachial plexus block   Pre-Anesthetic Checklist: ,, timeout performed, Correct Patient, Correct Site, Correct Laterality, Correct Procedure, Correct Position, site marked, Risks and benefits discussed, pre-op evaluation,  At surgeon's request and post-op pain management  Laterality: Left  Prep: Maximum Sterile Barrier Precautions used, chloraprep       Needles:  Injection technique: Single-shot  Needle Type: Echogenic Stimulator Needle     Needle Length: 5cm  Needle Gauge: 22     Additional Needles:   Procedures:,,,, ultrasound used (permanent image in chart),,,,  Narrative:  Start time: 01/27/2019 7:52 AM End time: 01/27/2019 8:02 AM Injection made incrementally with aspirations every 5 mL. Anesthesiologist: Roderic Palau, MD  Additional Notes: 2% Lidocaine skin wheel.

## 2019-01-27 NOTE — Anesthesia Procedure Notes (Signed)
Procedure Name: Intubation Date/Time: 01/27/2019 8:40 AM Performed by: Niel Hummer, CRNA Pre-anesthesia Checklist: Patient being monitored, Suction available, Emergency Drugs available and Patient identified Patient Re-evaluated:Patient Re-evaluated prior to induction Oxygen Delivery Method: Circle system utilized Preoxygenation: Pre-oxygenation with 100% oxygen Induction Type: IV induction and Rapid sequence Laryngoscope Size: Mac and 4 Grade View: Grade I Tube type: Oral Tube size: 7.5 mm Number of attempts: 1 Airway Equipment and Method: Stylet Placement Confirmation: ETT inserted through vocal cords under direct vision,  positive ETCO2 and breath sounds checked- equal and bilateral Secured at: 23 cm Tube secured with: Tape Dental Injury: Teeth and Oropharynx as per pre-operative assessment

## 2019-01-27 NOTE — Anesthesia Postprocedure Evaluation (Signed)
Anesthesia Post Note  Patient: SOHAM HOLLETT  Procedure(s) Performed: REVERSE SHOULDER ARTHROPLASTY (Left )     Patient location during evaluation: PACU Anesthesia Type: General and Regional Level of consciousness: awake and alert Pain management: pain level controlled Vital Signs Assessment: post-procedure vital signs reviewed and stable Respiratory status: spontaneous breathing, nonlabored ventilation, respiratory function stable and patient connected to nasal cannula oxygen Cardiovascular status: blood pressure returned to baseline and stable Postop Assessment: no apparent nausea or vomiting Anesthetic complications: no    Last Vitals:  Vitals:   01/27/19 1215 01/27/19 1225  BP: 102/78 (!) 120/104  Pulse: 82 82  Resp: 19 12  Temp:  36.6 C  SpO2: 98% 98%    Last Pain:  Vitals:   01/27/19 1225  TempSrc:   PainSc: 0-No pain      LLE Sensation: Numbness (01/27/19 1225)   RLE Sensation: Full sensation (01/27/19 1225)      Jessyca Sloan,W. EDMOND

## 2019-01-27 NOTE — Op Note (Addendum)
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  Additionally his supraspinatus was peeled away and his previous repair partially failed.  We felt that reverse shoulder arthroplasty was beneficial in this patient.  Good seating of implants.  Were able to remove his coracoid transfer screw without issue.  Large loose bodies removed x4 with 1 as large as 2 x 3 cm.  Were unable to feel the axillary nerve secondary to previous coracoid transfer and to stayed away from it instead.  We obtained multiple tissue samples but there is no sign of purulence or infection.  Post-operative plan: The patient will be NWB in sling.  The patient will be admitted overnight.  DVT prophylaxis not indicated in isolated upper extremity surgery patient with no specific risks factors.  Pain control with PRN pain medication preferring oral medicines.  Follow up plan will be scheduled in approximately 7 days for incision check and XR.  Physical therapy to start after first visit.  Post-Op Diagnosis: Same Surgeons:Primary: Hiram Gash, MD Assistants:Brandon Lynnell Jude Location: [facility] ROOM 06 Anesthesia: Choice Antibiotics: Ancef 2g preop, Vancomycin 1000mg  locally with 1.2 g of tobramycin Tourniquet time: None Estimated Blood Loss: 497 Complications: None Specimens: 3 - L shoulder #1-3 for 4 week hold for P [place] Implants: Implant Name Type Inv. Item Serial No. Manufacturer Lot No. LRB No. Used Action  AEQUALIS PERFORM+REVERSED LATERALIZED BASEPLATE Orthopedic Implant  [id number]   Left 1  Implanted  BONE SCREW THREAD 6.5X35MM - [id number] Screw BONE SCREW THREAD 6.5X35MM  [employer]  Left 1 Implanted  SCREW 5.5X26 - [id number] Screw SCREW 5.5X26  [employer]  Left 1 Implanted  SCREW 5.0X38 SMALL F/PERFORM - [id number] Screw SCREW 5.0X38 SMALL F/PERFORM  [employer]  Left 1 Implanted  GLENOSPHERE STANDARD 39 - [id number] Joint GLENOSPHERE STANDARD 39 [id number] [employer]  Left 1 Implanted  IMPLANT REVERSE SHOULDER 0X3.5 - [id number] Shoulder IMPLANT REVERSE SHOULDER 0X3.5 [id number] [employer]  Left 1 Implanted    Indications for Surgery:   Patrick [name] is a 63 y.o. male with end-stage left shoulder arthritis after previous surgeries including a car transfer 30 years ago and a subsequent open rotator cuff repair about 20 years ago.  Patient did have some concerns for his cuff integrity prior to surgery and additionally he had some potential wound healing concerns after his initial surgery however his labs were all normal.  Benefits and risks of operative and nonoperative management were discussed prior to surgery with patient/guardian(s) and informed consent form was completed.  Infection and need for further surgery were discussed as was prosthetic stability and cuff issues.  We additionally specifically discussed risks of axillary nerve injury, infection, periprosthetic fracture, continued pain and longevity of implants prior to beginning procedure.      Procedure:   The patient was identified in the preoperative holding area where the surgical site was marked. Block placed by anesthesia with exparel.  The patient was taken to the OR where a procedural timeout was called and the above noted anesthesia was induced.  The patient was positioned beachchair on allen table with spider arm positioner.  Preoperative antibiotics were dosed.  The patient's left shoulder was prepped and draped in the usual sterile fashion.  A second preoperative timeout was called.      Standard  deltopectoral approach was performed with a #10 blade.  It was centered between his mid axillary incision from his coracoid transfer as well as from his mini open approach from his previous cuff repair.  We dissected down to the subcutaneous tissues and the cephalic vein was taken laterally with the deltoid. Clavipectoral fascia was incised in line with the incision. Deep retractors were placed.  Anatomy was somewhat distorted as there is previous scar from his 2 open procedures.  We are able to identify the remnant of what used to be the conjoined tendon however this was transferred to the anterior portion of the glenoid.  We kept our deep plate of our retractor subcutaneous primarily.  There was significant osteophytosis in this made our approach very difficult.  The long of the biceps tendon was identified and there was significant tenosynovitis present.  Tenodesis was performed to the pectoralis tendon with #2 Ethibond. The remaining biceps was followed up into the rotator interval where it was released.   The subscapularis was taken down in a full thickness layer with capsule along the humeral neck extending inferiorly around the humeral head.  Through this we were able to identify the subscapularis was very atrophied and minimally mobile.  It was not good tissue quality or worried about the possibility of doing a total shoulder arthroplasty as this tissue seemed likely to fail.  The humeral head had evidence of severe osteoarthritic wear with full-thickness cartilage loss and exposed subchondral bone. There was significant flattening of the humeral head.  Sutures from his previous repair were in place and had partially failed.  The rotator cuff was carefully examined and noted to be irreperably torn superiorly in the supraspinatus.  The decision was confirmed that a reverse total shoulder was indicated for this patient.  There were osteophytes along the inferior humeral neck. The osteophytes were  removed with an osteotome and a rongeur.  Osteophytes were removed with a rongeur and an osteotome and the anatomic neck was well visualized.     A humeral cutting guide was inserted down the intramedullary canal. The version was set at 20 of retroversion. Humeral osteotomy was performed with an oscillating saw. The head fragment was passed off the back table. A starter awl was used to open the humeral canal. We next used T-handle straight sound reamers to ream up to an appropriate fit. A chisel was used to remove proximal humeral bone. We then broached starting with a size one broach and broaching up to 2 which obtained an appropriate fit. The broach handle was removed. A cut protector was placed. The broach handle was removed and a cut protector was placed. The humerus was retracted posteriorly and we turned our attention to glenoid exposure.  The subscapularis was again identified and we would normally have performed a tug test and identified the axillary nerve however the setting of this patient's previous surgery state on bone and instead peeled away the anterior capsule to visualize the coracoid transfer itself.  There is a large coracoid fragment that had been fixed anteriorly.  There is good fixation of this and we felt that removing the screw was necessary to proceed with the risk for surgery.  We were able to remove the screw which was relatively loose considering it in place for quite some time.  This went without issue with a cruciate screwdriver.  At this point multiple large loose bodies were taken from the subcoracoid and sub-scapular recess as well as the inferior recess.  The largest of these was 2 x 3 cm.  The glenoid was inspected and had evidence of severe osteoarthritic wear with full-thickness cartilage loss and exposed subchondral bone. The remaining labrum was removed circumferentially taking great care not to disrupt the posterior capsule.   The glenoid drill guide with 10 degrees  of offset was placed and used to drill a guide pin in the center, inferior position. The glenoid face was then reamed concentrically over the guide wire. The center hole was drilled over the guidepin in a near anatomic angle of version. Next the  glenoid vault was drilled back to a depth of 40 mm.  We tapped and then placed a 77mm size baseplate with 58mm lateralization was selected with a 6.5 mm x 40 mm length central screw.  We had good apposition of the plate to bone though there was admittedly about 15 to 20% posteriorly that had completely eroded away and likely would have required significant bony resection to have seeding.  We had more than 80% seating.  The base plate was screwed into the glenoid vault obtaining secure fixation. We next placed superior and inferior locking screws for additional fixation.  Next a 39 mm glenosphere was selected and impacted onto the baseplate. The center screw was tightened.  We turned attention back to the humeral side. The cut protector was removed. We trialed with multiple size tray and polyethylene options and selected a +6 which provided good stability and range of motion without excess soft tissue tension. The offset was dialed in to match the normal anatomy. The shoulder was trialed.  There was good ROM in all planes and the shoulder was stable with no inferior translation.  The real humeral implants were opened after again confirming sizes.  The trial was removed. #5 Ethibond sutures passed through the humeral neck for subscap repair. The humeral component was press-fit obtaining a secure fit. A +0 high offset tray was selected and impacted onto the stem.  A 39+6 polyethylene liner was impacted onto the stem.  The joint was reduced and thoroughly irrigated with pulsatile lavage. Subscap was repaired back with #5 FiberWire sutures through bone tunnels. Hemostasis was obtained. The deltopectoral interval was reapproximated with #1 Ethibond. The subcutaneous tissues  were closed with 2-0 Vicryl and the skin was closed with running monocryl.    The wounds were cleaned and dried and an Aquacel dressing was placed. The drapes taken down. The arm was placed into sling with abduction pillow. Patient was awakened, extubated, and transferred to the recovery room in stable condition. There were no intraoperative complications. The sponge, needle, and attention counts were  correct at the end of the case.    Joya Gaskins, OPA-C, present and scrubbed throughout the case, critical for completion in a timely fashion, and for retraction, instrumentation, closure.

## 2019-01-27 NOTE — Transfer of Care (Signed)
Immediate Anesthesia Transfer of Care Note  Patient: Patrick [name]  Procedure(s) Performed: REVERSE SHOULDER ARTHROPLASTY (Left )  Patient Location: PACU  Anesthesia Type:General  Level of Consciousness: awake, alert  and oriented  Airway & Oxygen Therapy: Patient Spontanous Breathing and Patient connected to face mask oxygen  Post-op Assessment: Report given to RN and Post -op Vital signs reviewed and stable  Post vital signs: Reviewed and stable  Last Vitals:  Vitals Value Taken Time  BP 98/65 01/27/19 1116  Temp    Pulse 82 01/27/19 1118  Resp 13 01/27/19 1118  SpO2 98 % 01/27/19 1118  Vitals shown include unvalidated device data.  Last Pain:  Vitals:   01/27/19 0722  TempSrc: Oral  PainSc:       Patients Stated Pain Goal: 4 (09/81/19 1478)  Complications: No apparent anesthesia complications
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
## 2019-01-28 MED ORDER — OXYCODONE HCL 5 MG PO TABS: ORAL_TABLET | ORAL | 0 refills | Status: AC

## 2019-01-28 MED ORDER — DOXYCYCLINE HYCLATE 50 MG PO CAPS: 100.0000 mg | ORAL_CAPSULE | Freq: Two times a day (BID) | ORAL | 0 refills | Status: AC

## 2019-01-28 MED ORDER — ACETAMINOPHEN 500 MG PO TABS: 1000.0000 mg | ORAL_TABLET | Freq: Three times a day (TID) | ORAL | 0 refills | Status: AC

## 2019-01-28 MED ORDER — DICLOFENAC SODIUM 50 MG PO TBEC: 50.0000 mg | DELAYED_RELEASE_TABLET | Freq: Two times a day (BID) | ORAL | 0 refills | Status: DC

## 2019-01-28 MED ORDER — ONDANSETRON HCL 4 MG PO TABS: 4.0000 mg | ORAL_TABLET | Freq: Three times a day (TID) | ORAL | 1 refills | Status: AC | PRN

## 2019-01-28 NOTE — Plan of Care (Signed)
  Problem: Clinical Measurements: Goal: Ability to maintain clinical measurements within normal limits will improve Outcome: Adequate for Discharge Goal: Will remain free from infection Outcome: Adequate for Discharge Goal: Diagnostic test results will improve Outcome: Adequate for Discharge   Problem: Elimination: Goal: Will not experience complications related to bowel motility Outcome: Adequate for Discharge

## 2019-01-28 NOTE — Evaluation (Signed)
Occupational Therapy Evaluation Patient Details Name: Patrick [name] MRN: [id number] DOB: 05/16/1956 Today's Date: 01/28/2019    History of Present Illness 63 year old man s/p L RTSA   Clinical Impression   Pt was admitted for the above sx. All education was completed. Pt has been working around shoulder without moving it.  Educated on sling, precautions, non-movement of shoulder and allowed elbow to finger ROM    Follow Up Recommendations  Follow surgeon's recommendation for DC plan and follow-up therapies    Equipment Recommendations  None recommended by OT    Recommendations for Other Services        Precautions / Restrictions Precautions Precautions: Shoulder Type of Shoulder Precautions: sling on except for bathing/dressing/exercises. No shoulder ROM, elbow to finger ROM allowed Shoulder Interventions: Shoulder abduction pillow Precaution Booklet Issued: Yes (comment) Required Braces or Orthoses: Sling(abductor) Restrictions Weight Bearing Restrictions: Yes LUE Weight Bearing: Non weight bearing      Mobility Bed Mobility               General bed mobility comments: sitting EOB. Pt plans to sleep in recliner  Transfers Overall transfer level: Independent                    Balance                                           ADL either performed or assessed with clinical judgement   ADL Overall ADL's : Needs assistance/impaired                                       General ADL Comments: pt moving around room independently.  Min A for UB adls. Donned pants/underwear himself     Museum/gallery curator      Pertinent Vitals/Pain Pain Assessment: No/denies pain(nerve block still in effect; able to move fingers)     Hand Dominance     Extremity/Trunk Assessment Upper Extremity Assessment Upper Extremity Assessment: LUE deficits/detail LUE Deficits / Details: nerve block in effect.  Able to  move fingers, wrist and supinate.  Difficulty bringing elbow into flexion; performed AAROM himself to avoid strain           Communication Communication Communication: No difficulties   Cognition Arousal/Alertness: Awake/alert Behavior During Therapy: WFL for tasks assessed/performed Overall Cognitive Status: Within Functional Limits for tasks assessed                                     General Comments  reviewed shoulder protocol and gave him handouts. He doesn't feel wife will need to practice sling, and he assisted with donning (min/mod A).  Worked through elbow to finger ROM    Exercises     Shoulder Instructions      Home Living Family/patient expects to be discharged to:: Private residence Living Arrangements: Spouse/significant other                                      Prior Functioning/Environment Level of Independence: Independent  Comments: pt very independent and active; went to exercise class just prior to admission. He is used to not moving arm for adls        OT Problem List:        OT Treatment/Interventions:      OT Goals(Current goals can be found in the care plan section) Acute Rehab OT Goals Patient Stated Goal: return to active lifestyle, independence, and decreased pain OT Goal Formulation: All assessment and education complete, DC therapy  OT Frequency:     Barriers to D/C:            Co-evaluation              AM-PAC OT "6 Clicks" Daily Activity     Outcome Measure Help from another person eating meals?: None Help from another person taking care of personal grooming?: None Help from another person toileting, which includes using toliet, bedpan, or urinal?: None Help from another person bathing (including washing, rinsing, drying)?: A Little Help from another person to put on and taking off regular upper body clothing?: A Little Help from another person to put on and taking off regular lower  body clothing?: A Little 6 Click Score: 21   End of Session    Activity Tolerance: Patient tolerated treatment well Patient left: in bed;with call bell/phone within reach  OT Visit Diagnosis: Muscle weakness (generalized) (M62.81)                Time: 6301-6010 OT Time Calculation (min): 21 min Charges:  OT General Charges $OT Visit: 1 Visit OT Evaluation $OT Eval Low Complexity: 1 Low  Lesle Chris, OTR/L Acute Rehabilitation Services [phone] [facility] pager [phone] office 01/28/2019  [place] 01/28/2019, 8:44 AM

## 2019-01-28 NOTE — Plan of Care (Signed)
Plan of care reviewed and discussed with the patient. 

## 2019-01-28 NOTE — Discharge Summary (Signed)
Patient ID: Patrick [name] MRN: [id number] DOB/AGE: [birth year] 63 y.o.  Admit date: 01/27/2019 Discharge date: 01/28/2019  Admission Diagnoses:L shoulder arthritis  Discharge Diagnoses:  Active Problems:   Degenerative arthritis of left shoulder region   Past Medical History:  Diagnosis Date  . ACL sprain 11/2014   right  . Allergy   . Arthritis    shoulders,  . Basal cell carcinoma   . BPH (benign prostatic hyperplasia)    high PSA level  . Chronic lower back pain   . Dental crowns present   . Diverticulosis   . GERD (gastroesophageal reflux disease)   . History of colon polyps   . History of umbilical hernia   . Plantar fasciitis   . Seasonal allergies   . Tendonitis      Procedures Performed: Left reverse total shoulder arthroplasty  Discharged Condition: good  Hospital Course: Patient brought in as an outpatient for surgery.  Tolerated procedure well.  Was kept for monitoring overnight for pain control and medical monitoring postop and was found to be stable for DC home the morning after surgery.  Patient was instructed on specific activity restrictions and all questions were answered.   Consults: None  Significant Diagnostic Studies: No additional pertinent studies  Treatments: Surgery  Discharge Exam:  Dressing CDI and sling well fitting,  full and painless ROM throughout hand with DPC of 0.  Axillary nerve sensation/motor altered in setting of block and unable to be fully tested.  Distal motor and sensory altered in setting of block.   Disposition: Discharge disposition: 01-Home or Self Care       Discharge Instructions    Call MD for:  persistant nausea and vomiting   Complete by: As directed    Call MD for:  redness, tenderness, or signs of infection (pain, swelling, redness, odor or green/yellow discharge around incision site)   Complete by: As directed    Call MD for:  severe uncontrolled pain   Complete by: As directed    Diet - low sodium  heart healthy   Complete by: As directed    Discharge instructions   Complete by: As directed    Ophelia Charter MD, MPH [place]. [address], [address] [phone] (tel)   [phone] (fax)   [place] may leave the operative dressing in place until your follow-up appointment. KEEP THE INCISIONS CLEAN AND DRY. Use the Cryocuff, GameReady or Ice as often as possible for the first 3-4 days, then as needed for pain relief.  You may shower on Post-Op Day #2. The dressing is water resistant but do not scrub it as it may start to peel up.  You may remove the sling for showering, but keep a water resistant pillow under the arm to keep both the elbow and shoulder away from the body (mimicking the abduction sling). Gently pat the area dry. Do not soak the shoulder in water. Do not go swimming in the pool or ocean until your sutures are removed.  EXERCISES Wear the sling at all times except when doing your exercises. You may remove the sling for showering, but keep the arm across the chest or in a secondary sling.   Accidental/Purposeful External Rotation and shoulder flexion (reaching behind you) is to be avoided at all costs for the first month. Please perform the exercises:   Elbow / Hand / Wrist  Range of Motion Exercises  FOLLOW-UP  If you develop a Fever (>101.5), Redness or Drainage from the surgical incision site, please call our office to arrange for an evaluation. Please call the office to schedule a follow-up appointment for a wound check, 7-10 days post-operatively.    IF YOU HAVE ANY QUESTIONS, PLEASE FEEL FREE TO CALL OUR OFFICE.   HELPFUL INFORMATION  Your arm will be in a sling following surgery. You will be in this sling for the next 3-4 weeks.  I will let you know the exact duration at your follow-up visit.  You may be more comfortable sleeping in a semi-seated position the first few  nights following surgery.  Keep a pillow propped under the elbow and forearm for comfort.  If you have a recliner type of chair it might be beneficial.  If not that is fine too, but it would be helpful to sleep propped up with pillows behind your operated shoulder as well under your elbow and forearm.  This will reduce pulling on the suture lines.  We suggest you use the pain medication the first night prior to going to bed, in order to ease any pain when the anesthesia wears off. You should avoid taking pain medications on an empty stomach as it will make you nauseous.  Do not drink alcoholic beverages or take illicit drugs when taking pain medications.  In most states it is against the law to drive while your arm is in a sling. And certainly against the law to drive while taking narcotics.  You may return to work/school in the next couple of days when you feel up to it. Desk work and typing in the sling is fine.  When dressing, put your operative arm in the sleeve first.  When getting undressed, take your operative arm out last.  Loose fitting, button-down shirts are recommended.  Pain medication may make you constipated.  Below are a few solutions to try in this order: Decrease the amount of pain medication if you aren't having pain. Drink lots of decaffeinated fluids. Drink prune juice and/or each dried prunes  If the first 3 don't work start with additional solutions Take Colace - an over-the-counter stool softener Take Senokot - an over-the-counter laxative Take Miralax - a stronger over-the-counter laxative   Increase activity slowly   Complete by: As directed      Allergies as of 01/28/2019      Reactions   Penicillins Anaphylaxis   Did it involve swelling of the face/tongue/throat, SOB, or low BP? Yes Did it involve sudden or severe rash/hives, skin peeling, or any reaction on the inside of your mouth or nose? No Did you need to seek medical attention at a hospital or doctor's  office? Yes When did it last happen?Childhood allergy If all above answers are "NO", may proceed with cephalosporin use.      Medication List    TAKE these medications   acetaminophen 500 MG tablet Commonly known as: TYLENOL Take 2 tablets (1,000 mg total) by mouth every 8 (eight) hours for 14 days. What changed:   when to take this  reasons to take this   diclofenac 50 MG EC tablet Commonly known as: VOLTAREN Take 1 tablet (50 mg total) by mouth 2 (two) times daily.   doxycycline 50 MG capsule Commonly known as: VIBRAMYCIN Take 2 capsules (100 mg total) by mouth 2 (two) times daily for 30 days.   omeprazole 20 MG capsule Commonly known as: PRILOSEC Take 20 mg by mouth daily.   ondansetron  4 MG tablet Commonly known as: Zofran Take 1 tablet (4 mg total) by mouth every 8 (eight) hours as needed for up to 7 days for nausea or vomiting.   oxyCODONE 5 MG immediate release tablet Commonly known as: Oxy IR/ROXICODONE Take 1-2 pills every 6 hrs as needed for pain, no more than 6 per day   testosterone cypionate 200 MG/ML injection Commonly known as: DEPOTESTOSTERONE CYPIONATE Inject 200 mg into the muscle every 14 (fourteen) days.   UNABLE TO FIND Take 650 mg by mouth daily. Infla 650   ZYRTEC PO Take 10 mg by mouth daily as needed (allergies).

## 2019-01-28 NOTE — Progress Notes (Signed)
Discharge paperwork discussed with pt at the bedside.  He demonstrated understanding. Pt was escorted to main lobby in stable condition.

## 2019-01-29 MED FILL — Vancomycin HCl For IV Soln 1 GM (Base Equivalent): INTRAVENOUS | Qty: 1000 | Status: AC

## 2019-02-01 ENCOUNTER — Encounter (HOSPITAL_COMMUNITY): Payer: Self-pay | Admitting: Orthopaedic Surgery

## 2019-02-01 LAB — AEROBIC/ANAEROBIC CULTURE W GRAM STAIN (SURGICAL/DEEP WOUND): Gram Stain: NONE SEEN

## 2019-02-02 DIAGNOSIS — R972 Elevated prostate specific antigen [PSA]: Secondary | ICD-10-CM | POA: Diagnosis not present

## 2019-02-02 LAB — AEROBIC/ANAEROBIC CULTURE W GRAM STAIN (SURGICAL/DEEP WOUND): Gram Stain: NONE SEEN

## 2019-02-03 ENCOUNTER — Telehealth: Payer: Self-pay

## 2019-02-03 NOTE — Telephone Encounter (Signed)
Patient made aware of upcoming appointment date/time/location. Eugenia Mcalpine, LPN

## 2019-02-03 NOTE — Telephone Encounter (Signed)
-----   Message from Truman Hayward, MD sent at 02/03/2019  9:05 AM EDT ----- Can we call him in about an 2 hrs re appt I just made for him for tomorrow. He is pt of Ophelia Charter with infected shoulder. Dr. Hermenia Bers to call him in a few mins

## 2019-02-04 ENCOUNTER — Other Ambulatory Visit: Payer: Self-pay

## 2019-02-04 ENCOUNTER — Telehealth: Payer: Self-pay | Admitting: *Deleted

## 2019-02-04 ENCOUNTER — Telehealth: Payer: Self-pay

## 2019-02-04 ENCOUNTER — Ambulatory Visit: Payer: BC Managed Care – PPO | Admitting: Infectious Disease

## 2019-02-04 ENCOUNTER — Encounter: Payer: Self-pay | Admitting: Infectious Disease

## 2019-02-04 DIAGNOSIS — T8459XA Infection and inflammatory reaction due to other internal joint prosthesis, initial encounter: Secondary | ICD-10-CM

## 2019-02-04 DIAGNOSIS — Z96619 Presence of unspecified artificial shoulder joint: Secondary | ICD-10-CM

## 2019-02-04 DIAGNOSIS — A498 Other bacterial infections of unspecified site: Secondary | ICD-10-CM

## 2019-02-04 HISTORY — DX: Infection and inflammatory reaction due to other internal joint prosthesis, initial encounter: T84.59XA

## 2019-02-04 HISTORY — DX: Other bacterial infections of unspecified site: A49.8

## 2019-02-04 MED ORDER — RIFAMPIN 300 MG PO CAPS: 300.0000 mg | ORAL_CAPSULE | Freq: Two times a day (BID) | ORAL | 5 refills | Status: DC

## 2019-02-04 MED ORDER — LINEZOLID 600 MG PO TABS: 600.0000 mg | ORAL_TABLET | Freq: Two times a day (BID) | ORAL | 1 refills | Status: DC

## 2019-02-04 NOTE — Telephone Encounter (Signed)
Thanks [place]. Too bad no one gave him ceph by accident I bet he would be fine

## 2019-02-04 NOTE — Telephone Encounter (Signed)
Per Dr Virgina Jock, in the last 10 years, he has only prescribed doxycycline (1 round), levofloxacin (1 round), azithromycin (2 rounds).  Patient has not required frequent antibiotics, they have been able to avoid penicillin/cephalosporins. Landis Gandy, RN

## 2019-02-04 NOTE — Telephone Encounter (Signed)
Per Dr Tommy Medal, initiated a PICC insertion appointment for [facility].    Faxed over patient demographics, snapshot, insurance information, all except the interventional radiology appointment and office notes from today's visit to [facility].  Called IR, Spoke to [place], and scheduled appointment for 02/05/2019 at 2 pm to have the PICC line inserted with patient needing to arrive by 1:45 pm.   Patrick [name] at [facility] and notified of fax sent earlier and provided her with the interventional radiology appointment for tomorrow 02/05/2019 at [facility] to notify about appointment with IR to have his PICC line placed tomorrow, he agreed to time and date. No questions at this moment.  There will not be any need to schedule short stay appointment since Patrick [name] has been given Vancomycin in the last week without any reactions.    --Pending office notes to be faxed to [facility].

## 2019-02-04 NOTE — Progress Notes (Signed)
Reason for for infectious disease consult : prosthetic joint infection  Resting physician: Ophelia Charter, MD  Subjective:    Patient ID: Patrick [name], male    DOB: Jan 28, 1956, 63 y.o.   MRN: [id number]  HPI  Patrick [name] is a 63 year old Caucasian man with past medical history significant for multiple orthopedic procedures.  He has a background in martial arts and also is very active as a cross fitter.  Most significantly he was suffering from end-stage left shoulder arthritis after previous surgeries including a car transfer 30 years ago and a subsequent open rotator cuff repair about 20 years ago.  Patient did have some concerns for his cuff integrity prior to surgery and additionally he had some potential wound healing concerns after his initial surgery however his labs were all normal.    He proceeded with surgery with Dr. Griffin Basil  In OR Dr. Griffin Basil found significant degenerative changes that made it impossible to do a total shoulder arthroplasty so instead he proceeded with a reverse total shoulder arthroplasty.  He did remove 1 of the screws from prior surgery.  Multiple cultures were taken and these of all come back with Propionibacterium acnes.  The patient had been on doxycycline postoperatively.  He has a suppose a penicillin allergy which he does not recall but which his mother told him he had when he was a child and from which he "almost died" on talk to the patient he never was hospitalized but he does not recall the nature of the allergic reaction.  I can find no documentation of him receiving a cephalosporin or amoxicillin or penicillin in our system.  I called [facility] where he receives his primary care and they found no record in the last 10 years of him ever receiving a beta-lactam.  This is been unfortunate because I would prefer to proceed with a beta-lactam such as ceftriaxone for treatment.  Until he can have some proof that he does not have an allergy  reaction to beta lactams that is significant we will need to proceed with other antibiotics.  I am ordering a PICC line for him but in the interim I am starting him on Zyvox 600 mg twice daily along with rifampin 300 mg twice daily.    Past Medical History:  Diagnosis Date  . ACL sprain 11/2014   right  . Allergy   . Arthritis    shoulders,  . Basal cell carcinoma   . BPH (benign prostatic hyperplasia)    high PSA level  . Chronic lower back pain   . Dental crowns present   . Diverticulosis   . GERD (gastroesophageal reflux disease)   . History of colon polyps   . History of umbilical hernia   . Plantar fasciitis   . Propionibacterium infection 02/04/2019  . Prosthetic shoulder infection ([place]) 02/04/2019  . Seasonal allergies   . Tendonitis     Past Surgical History:  Procedure Laterality Date  . ARTHROSCOPY WITH ANTERIOR CRUCIATE LIGAMENT (ACL) REPAIR WITH ANTERIOR TIBILIAS GRAFT Right 12/08/2014   Procedure: RIGHT KNEE ARTHROSCOPY WITH ANTERIOR CRUCIATE LIGAMENT (ACL) REPAIR;  Surgeon: Kathryne Hitch, MD;  Location: [facility];  Service: Orthopedics;  Laterality: Right;  . CHONDROPLASTY Right 12/08/2014   Procedure: CHONDROPLASTY PATELLO-FEMORAL JOINT;  Surgeon: Kathryne Hitch, MD;  Location: [facility];  Service: Orthopedics;  Laterality: Right;  . COLONOSCOPY  2020  . COLONOSCOPY WITH PROPOFOL  10/15/2012  . HERNIA REPAIR    .  KNEE ARTHROSCOPY Right 09/23/2002  . KNEE ARTHROSCOPY W/ ACL RECONSTRUCTION Right 01/29/2007  . KNEE ARTHROSCOPY WITH DRILLING/MICROFRACTURE Right 12/08/2014   Procedure: DRILLING/MICROFRACTURE PATELLO-FEMORAL JOINT;  Surgeon: Kathryne Hitch, MD;  Location: [facility];  Service: Orthopedics;  Laterality: Right;  . KNEE ARTHROSCOPY WITH LATERAL MENISECTOMY Right 12/08/2014   Procedure: PARTIAL LATERAL MENISECTOMY;  Surgeon: Kathryne Hitch, MD;  Location: [facility];  Service: Orthopedics;  Laterality:  Right;  . LEG SURGERY Right 1994   [facility]  . LESION REMOVAL Right 01/28/2013   Procedure: BASAL CELL CARCINOMA REMOVAL RIGHT LOWER LIP AND RIGHT UPPER CHEEK WITH FROZEN SECTION X2;  Surgeon: Charlene Brooke, MD;  Location: [facility];  Service: Plastics;  Laterality: Right;  . REVERSE SHOULDER ARTHROPLASTY Left 01/27/2019   Procedure: REVERSE SHOULDER ARTHROPLASTY;  Surgeon: Hiram Gash, MD;  Location: [facility];  Service: Orthopedics;  Laterality: Left;  . rotator cuff removal     left  . UMBILICAL HERNIA REPAIR      Family History  Problem Relation Age of Onset  . Colon cancer Neg Hx   . Colon polyps Neg Hx   . Esophageal cancer Neg Hx   . Rectal cancer Neg Hx   . Stomach cancer Neg Hx       Social History   Socioeconomic History  . Marital status: Married    Spouse name: Not on file  . Number of children: Not on file  . Years of education: Not on file  . Highest education level: Not on file  Occupational History  . Not on file  Social Needs  . Financial resource strain: Not on file  . Food insecurity    Worry: Not on file    Inability: Not on file  . Transportation needs    Medical: Not on file    Non-medical: Not on file  Tobacco Use  . Smoking status: Never Smoker  . Smokeless tobacco: Never Used  Substance and Sexual Activity  . Alcohol use: Yes    Comment: occ beer  . Drug use: No  . Sexual activity: Yes  Lifestyle  . Physical activity    Days per week: Not on file    Minutes per session: Not on file  . Stress: Not on file  Relationships  . Social Herbalist on phone: Not on file    Gets together: Not on file    Attends religious service: Not on file    Active member of club or organization: Not on file    Attends meetings of clubs or organizations: Not on file    Relationship status: Not on file  Other Topics Concern  . Not on file  Social History Narrative  . Not on file    Allergies  Allergen Reactions  .  Penicillins Anaphylaxis    Did it involve swelling of the face/tongue/throat, SOB, or low BP? Yes Did it involve sudden or severe rash/hives, skin peeling, or any reaction on the inside of your mouth or nose? No Did you need to seek medical attention at a hospital or doctor's office? Yes When did it last happen?Childhood allergy If all above answers are "NO", may proceed with cephalosporin use.      Current Outpatient Medications:  .  acetaminophen (TYLENOL) 500 MG tablet, Take 2 tablets (1,000 mg total) by mouth every 8 (eight) hours for 14 days., Disp: 84 tablet, Rfl: 0 .  Cetirizine HCl (ZYRTEC PO), Take 10 mg  by mouth daily as needed (allergies). , Disp: , Rfl:  .  diclofenac (VOLTAREN) 50 MG EC tablet, Take 1 tablet (50 mg total) by mouth 2 (two) times daily., Disp: 60 tablet, Rfl: 0 .  doxycycline (VIBRAMYCIN) 50 MG capsule, Take 2 capsules (100 mg total) by mouth 2 (two) times daily for 30 days., Disp: 120 capsule, Rfl: 0 .  omeprazole (PRILOSEC) 20 MG capsule, Take 20 mg by mouth daily., Disp: , Rfl:  .  ondansetron (ZOFRAN) 4 MG tablet, Take 1 tablet (4 mg total) by mouth every 8 (eight) hours as needed for up to 7 days for nausea or vomiting., Disp: 10 tablet, Rfl: 1 .  testosterone cypionate (DEPOTESTOSTERONE CYPIONATE) 200 MG/ML injection, Inject 200 mg into the muscle every 14 (fourteen) days. , Disp: , Rfl:  .  UNABLE TO FIND, Take 650 mg by mouth daily. Infla 650, Disp: , Rfl:   Review of Systems  Constitutional: Negative for chills and fever.  HENT: Negative for congestion and sore throat.   Eyes: Negative for photophobia.  Respiratory: Negative for cough, shortness of breath and wheezing.   Cardiovascular: Negative for chest pain, palpitations and leg swelling.  Gastrointestinal: Negative for abdominal pain, blood in stool, constipation, diarrhea, nausea and vomiting.  Genitourinary: Negative for dysuria, flank pain and hematuria.  Musculoskeletal: Positive for  arthralgias and joint swelling. Negative for back pain and myalgias.  Skin: Negative for rash.  Neurological: Negative for dizziness, weakness and headaches.  Hematological: Does not bruise/bleed easily.  Psychiatric/Behavioral: Negative for agitation, behavioral problems, confusion, decreased concentration, dysphoric mood, hallucinations and suicidal ideas. The patient is not nervous/anxious and is not hyperactive.        Objective:   Physical Exam Constitutional:      General: He is not in acute distress.    Appearance: He is not diaphoretic.  HENT:     Head: Normocephalic and atraumatic.     Right Ear: External ear normal.     Left Ear: External ear normal.     Nose: Nose normal.     Mouth/Throat:     Pharynx: No oropharyngeal exudate.  Eyes:     General: No scleral icterus.    Conjunctiva/sclera: Conjunctivae normal.     Pupils: Pupils are equal, round, and reactive to light.  Neck:     Musculoskeletal: Normal range of motion and neck supple.  Cardiovascular:     Rate and Rhythm: Normal rate and regular rhythm.     Heart sounds: Normal heart sounds.  Pulmonary:     Effort: Pulmonary effort is normal. No respiratory distress.     Breath sounds: No wheezing.  Abdominal:     General: There is no distension.     Palpations: Abdomen is soft.  Musculoskeletal:        General: No tenderness.  Lymphadenopathy:     Cervical: No cervical adenopathy.  Skin:    General: Skin is warm and dry.     Coloration: Skin is not pale.     Findings: No erythema or rash.  Neurological:     General: No focal deficit present.     Mental Status: He is alert and oriented to person, place, and time.     Coordination: Coordination normal.  Psychiatric:        Mood and Affect: Mood normal.        Behavior: Behavior normal.        Thought Content: Thought content normal.  Judgment: Judgment normal.     Left shoulder is in sling      Assessment & Plan:  Prosthetic joint infection  with Propionibacterium acnes: Given that all 3 cultures were positive and the nature of this organism I believe that is a true infection that was present but not known at the time of reverse shoulder arthroplasty.  I am concerned about the Arnell Sieving arthroplasty being infected now with Propionibacterium.  I have written orders for interventional radiology to place a PICC line.  We called his primary care office and there is no record of him having received a beta-lactam which is unfortunate.  I might consider in future referring him for penicillin allergy testing.  Absent proof that he can take a beta-lactam will have to go to other drugs.  Until he can get his PICC line I am having him start on Zyvox 600 mg twice daily along with rifampin 300 mg twice daily which she can start after he is first gotten used to taking the Zyvox.  Once he has his PICC line we will initiate vancomycin through [facility] to have him do 6 weeks of this along with rifampin 300 mg twice daily.  Then we will need to convert to an oral option which if we are still avoiding beta lactams might be clindamycin along with rifampin.  Will need to be treated for at least 6 months and carefully monitored.  I spent greater than 60 minutes with the patient including greater than 50% of time in face to face counsel of the patient the nature of prosthetic joint infections, management reasons for using helper antibiotic rifampin to penetrate biofilms, reviewing side effects of medications care and in coordination of their care  with home health and pharmacy.

## 2019-02-05 ENCOUNTER — Ambulatory Visit (HOSPITAL_COMMUNITY)
Admission: RE | Admit: 2019-02-05 | Discharge: 2019-02-05 | Disposition: A | Discharge status: Home / Self Care | Payer: BC Managed Care – PPO | Source: Ambulatory Visit | Attending: Infectious Disease | Admitting: Infectious Disease

## 2019-02-05 ENCOUNTER — Other Ambulatory Visit: Payer: Self-pay | Admitting: Infectious Disease

## 2019-02-05 DIAGNOSIS — Z96619 Presence of unspecified artificial shoulder joint: Secondary | ICD-10-CM

## 2019-02-05 DIAGNOSIS — T847XXA Infection and inflammatory reaction due to other internal orthopedic prosthetic devices, implants and grafts, initial encounter: Secondary | ICD-10-CM | POA: Diagnosis not present

## 2019-02-05 DIAGNOSIS — M009 Pyogenic arthritis, unspecified: Secondary | ICD-10-CM | POA: Insufficient documentation

## 2019-02-05 DIAGNOSIS — T8450XA Infection and inflammatory reaction due to unspecified internal joint prosthesis, initial encounter: Secondary | ICD-10-CM | POA: Diagnosis not present

## 2019-02-05 DIAGNOSIS — T8459XA Infection and inflammatory reaction due to other internal joint prosthesis, initial encounter: Secondary | ICD-10-CM

## 2019-02-05 DIAGNOSIS — A498 Other bacterial infections of unspecified site: Secondary | ICD-10-CM

## 2019-02-05 DIAGNOSIS — Z452 Encounter for adjustment and management of vascular access device: Secondary | ICD-10-CM | POA: Diagnosis not present

## 2019-02-05 DIAGNOSIS — Y838 Other surgical procedures as the cause of abnormal reaction of the patient, or of later complication, without mention of misadventure at the time of the procedure: Secondary | ICD-10-CM | POA: Diagnosis not present

## 2019-02-05 DIAGNOSIS — M19012 Primary osteoarthritis, left shoulder: Secondary | ICD-10-CM | POA: Diagnosis not present

## 2019-02-05 LAB — SEDIMENTATION RATE: Sed Rate: 29 mm/h — ABNORMAL HIGH (ref 0–20)

## 2019-02-05 LAB — C-REACTIVE PROTEIN: CRP: 15.8 mg/L — ABNORMAL HIGH (ref <8.0)

## 2019-02-05 MED ORDER — LIDOCAINE HCL 1 % IJ SOLN
INTRAMUSCULAR | Status: DC | PRN
  Administered 2019-02-05: 5 mL

## 2019-02-05 MED ORDER — HEPARIN SOD (PORK) LOCK FLUSH 100 UNIT/ML IV SOLN
INTRAVENOUS | Status: AC
  Filled 2019-02-05: qty 5

## 2019-02-05 MED ORDER — LIDOCAINE HCL 1 % IJ SOLN
INTRAMUSCULAR | Status: AC
  Filled 2019-02-05: qty 20

## 2019-02-05 MED ORDER — HEPARIN SOD (PORK) LOCK FLUSH 100 UNIT/ML IV SOLN
INTRAVENOUS | Status: DC | PRN
  Administered 2019-02-05: 500 [IU] via INTRAVENOUS

## 2019-02-05 NOTE — Procedures (Signed)
Successful placement of single lumen PICC line to right brachial vein. Length 42cm Tip at lower SVC/RA PICC capped No complications Ready for use.  EBL < 5 mL   Docia Barrier PA-C 02/05/2019 3:41 PM

## 2019-02-08 ENCOUNTER — Encounter: Payer: Self-pay | Admitting: Infectious Disease

## 2019-02-08 DIAGNOSIS — Z96619 Presence of unspecified artificial shoulder joint: Secondary | ICD-10-CM | POA: Diagnosis not present

## 2019-02-08 DIAGNOSIS — Y838 Other surgical procedures as the cause of abnormal reaction of the patient, or of later complication, without mention of misadventure at the time of the procedure: Secondary | ICD-10-CM | POA: Diagnosis not present

## 2019-02-08 DIAGNOSIS — T8450XA Infection and inflammatory reaction due to unspecified internal joint prosthesis, initial encounter: Secondary | ICD-10-CM | POA: Diagnosis not present

## 2019-02-08 DIAGNOSIS — T847XXA Infection and inflammatory reaction due to other internal orthopedic prosthetic devices, implants and grafts, initial encounter: Secondary | ICD-10-CM | POA: Diagnosis not present

## 2019-02-11 DIAGNOSIS — T8450XA Infection and inflammatory reaction due to unspecified internal joint prosthesis, initial encounter: Secondary | ICD-10-CM | POA: Diagnosis not present

## 2019-02-11 DIAGNOSIS — T847XXS Infection and inflammatory reaction due to other internal orthopedic prosthetic devices, implants and grafts, sequela: Secondary | ICD-10-CM | POA: Diagnosis not present

## 2019-02-11 DIAGNOSIS — Y838 Other surgical procedures as the cause of abnormal reaction of the patient, or of later complication, without mention of misadventure at the time of the procedure: Secondary | ICD-10-CM | POA: Diagnosis not present

## 2019-02-12 DIAGNOSIS — T8450XA Infection and inflammatory reaction due to unspecified internal joint prosthesis, initial encounter: Secondary | ICD-10-CM | POA: Diagnosis not present

## 2019-02-15 DIAGNOSIS — T8450XA Infection and inflammatory reaction due to unspecified internal joint prosthesis, initial encounter: Secondary | ICD-10-CM | POA: Diagnosis not present

## 2019-02-15 DIAGNOSIS — Y838 Other surgical procedures as the cause of abnormal reaction of the patient, or of later complication, without mention of misadventure at the time of the procedure: Secondary | ICD-10-CM | POA: Diagnosis not present

## 2019-02-15 DIAGNOSIS — T847XXD Infection and inflammatory reaction due to other internal orthopedic prosthetic devices, implants and grafts, subsequent encounter: Secondary | ICD-10-CM | POA: Diagnosis not present

## 2019-02-17 DIAGNOSIS — M19012 Primary osteoarthritis, left shoulder: Secondary | ICD-10-CM | POA: Diagnosis not present

## 2019-02-17 DIAGNOSIS — M6281 Muscle weakness (generalized): Secondary | ICD-10-CM | POA: Diagnosis not present

## 2019-02-17 DIAGNOSIS — M25612 Stiffness of left shoulder, not elsewhere classified: Secondary | ICD-10-CM | POA: Diagnosis not present

## 2019-02-17 DIAGNOSIS — Z96612 Presence of left artificial shoulder joint: Secondary | ICD-10-CM | POA: Diagnosis not present

## 2019-02-18 DIAGNOSIS — T847XXA Infection and inflammatory reaction due to other internal orthopedic prosthetic devices, implants and grafts, initial encounter: Secondary | ICD-10-CM | POA: Diagnosis not present

## 2019-02-18 DIAGNOSIS — X58XXXA Exposure to other specified factors, initial encounter: Secondary | ICD-10-CM | POA: Diagnosis not present

## 2019-02-18 DIAGNOSIS — T8450XA Infection and inflammatory reaction due to unspecified internal joint prosthesis, initial encounter: Secondary | ICD-10-CM | POA: Diagnosis not present

## 2019-02-19 DIAGNOSIS — T8450XA Infection and inflammatory reaction due to unspecified internal joint prosthesis, initial encounter: Secondary | ICD-10-CM | POA: Diagnosis not present

## 2019-02-22 DIAGNOSIS — Y838 Other surgical procedures as the cause of abnormal reaction of the patient, or of later complication, without mention of misadventure at the time of the procedure: Secondary | ICD-10-CM | POA: Diagnosis not present

## 2019-02-22 DIAGNOSIS — T8450XA Infection and inflammatory reaction due to unspecified internal joint prosthesis, initial encounter: Secondary | ICD-10-CM | POA: Diagnosis not present

## 2019-02-22 DIAGNOSIS — T847XXA Infection and inflammatory reaction due to other internal orthopedic prosthetic devices, implants and grafts, initial encounter: Secondary | ICD-10-CM | POA: Diagnosis not present

## 2019-02-23 DIAGNOSIS — M25612 Stiffness of left shoulder, not elsewhere classified: Secondary | ICD-10-CM | POA: Diagnosis not present

## 2019-02-23 DIAGNOSIS — M19012 Primary osteoarthritis, left shoulder: Secondary | ICD-10-CM | POA: Diagnosis not present

## 2019-02-23 DIAGNOSIS — M6281 Muscle weakness (generalized): Secondary | ICD-10-CM | POA: Diagnosis not present

## 2019-02-23 DIAGNOSIS — Z96612 Presence of left artificial shoulder joint: Secondary | ICD-10-CM | POA: Diagnosis not present

## 2019-02-25 ENCOUNTER — Other Ambulatory Visit (HOSPITAL_COMMUNITY)
Admission: RE | Admit: 2019-02-25 | Discharge: 2019-02-25 | Disposition: A | Discharge status: Home / Self Care | Payer: BC Managed Care – PPO | Source: Ambulatory Visit | Attending: Infectious Disease | Admitting: Infectious Disease

## 2019-02-25 DIAGNOSIS — T847XXA Infection and inflammatory reaction due to other internal orthopedic prosthetic devices, implants and grafts, initial encounter: Secondary | ICD-10-CM | POA: Insufficient documentation

## 2019-02-25 DIAGNOSIS — T8450XA Infection and inflammatory reaction due to unspecified internal joint prosthesis, initial encounter: Secondary | ICD-10-CM | POA: Diagnosis not present

## 2019-02-25 LAB — BASIC METABOLIC PANEL
Anion gap: 10 (ref 5–15)
BUN: 27 mg/dL — ABNORMAL HIGH (ref 6–20)
CO2: 25 mmol/L (ref 22–32)
Calcium: 9 mg/dL (ref 8.9–10.3)
Chloride: 106 mmol/L (ref 98–111)
Creatinine, Ser: 0.94 mg/dL (ref 0.61–1.24)
GFR calc Af Amer: >60 mL/min (ref >60)
GFR calc non Af Amer: >60 mL/min (ref >60)
Glucose, Bld: 104 mg/dL — ABNORMAL HIGH (ref 70–99)
Potassium: 3.7 mmol/L (ref 3.5–5.1)
Sodium: 141 mmol/L (ref 135–145)

## 2019-02-25 LAB — VANCOMYCIN, TROUGH: Vancomycin Tr: 18 ug/mL (ref 15–20)

## 2019-02-26 ENCOUNTER — Telehealth: Payer: Self-pay | Admitting: *Deleted

## 2019-02-26 DIAGNOSIS — T8450XA Infection and inflammatory reaction due to unspecified internal joint prosthesis, initial encounter: Secondary | ICD-10-CM | POA: Diagnosis not present

## 2019-02-26 NOTE — Telephone Encounter (Signed)
The only thing he is taking orally is Prilosec.

## 2019-02-26 NOTE — Telephone Encounter (Signed)
Is he taking oral rifampin? If so that Is the problem. SOME ppl just cannot take it. If he is on rifampin and having all of these symptoms he should just stop the rifampin and continue w IV vancomycin alone

## 2019-02-26 NOTE — Telephone Encounter (Signed)
Patient called to report that he feels horrible. He advised he has been having daily headaches, mood swings, extreme GERD no appetite and has lost 20 pounds since starting the IV Vanc. He states he has been doubling up on his Prilosec and it still does not help with the acid reflux. He only has 2 weeks left but he feels so bad he is not sure he can take it anymore. He denies fever, chills, and nausea.  Advised him will ask Tommy Medal if there is anything we can do to help with the acid reflux and give him a call back.

## 2019-02-27 ENCOUNTER — Other Ambulatory Visit: Payer: Self-pay | Admitting: Infectious Disease

## 2019-02-27 NOTE — Telephone Encounter (Signed)
I just called him today and he was indeed taking the rifampin which I have asked him to stop I am sure that is what is causing his symptoms

## 2019-03-01 DIAGNOSIS — T8450XA Infection and inflammatory reaction due to unspecified internal joint prosthesis, initial encounter: Secondary | ICD-10-CM | POA: Diagnosis not present

## 2019-03-01 DIAGNOSIS — Z96619 Presence of unspecified artificial shoulder joint: Secondary | ICD-10-CM | POA: Diagnosis not present

## 2019-03-01 DIAGNOSIS — Y838 Other surgical procedures as the cause of abnormal reaction of the patient, or of later complication, without mention of misadventure at the time of the procedure: Secondary | ICD-10-CM | POA: Diagnosis not present

## 2019-03-01 DIAGNOSIS — T847XXA Infection and inflammatory reaction due to other internal orthopedic prosthetic devices, implants and grafts, initial encounter: Secondary | ICD-10-CM | POA: Diagnosis not present

## 2019-03-02 DIAGNOSIS — M19012 Primary osteoarthritis, left shoulder: Secondary | ICD-10-CM | POA: Diagnosis not present

## 2019-03-04 ENCOUNTER — Telehealth: Payer: Self-pay | Admitting: Infectious Disease

## 2019-03-04 DIAGNOSIS — T8450XA Infection and inflammatory reaction due to unspecified internal joint prosthesis, initial encounter: Secondary | ICD-10-CM | POA: Diagnosis not present

## 2019-03-04 DIAGNOSIS — Z96619 Presence of unspecified artificial shoulder joint: Secondary | ICD-10-CM | POA: Diagnosis not present

## 2019-03-04 DIAGNOSIS — T847XXA Infection and inflammatory reaction due to other internal orthopedic prosthetic devices, implants and grafts, initial encounter: Secondary | ICD-10-CM | POA: Diagnosis not present

## 2019-03-04 DIAGNOSIS — Y838 Other surgical procedures as the cause of abnormal reaction of the patient, or of later complication, without mention of misadventure at the time of the procedure: Secondary | ICD-10-CM | POA: Diagnosis not present

## 2019-03-04 NOTE — Telephone Encounter (Signed)
COVID-19 Pre-Screening Questions:03/04/19  Do you currently have a fever (>100 F), chills or unexplained body aches? NO  Are you currently experiencing new cough, shortness of breath, sore throat, runny nose? NO    Have you recently travelled outside the state of [place] in the last 14 days? NO   Have you been in contact with someone that is currently pending confirmation of Covid19 testing or has been confirmed to have the [place] virus?  NO  **If the patient answers NO to ALL questions -  advise the patient to please call the clinic before coming to the office should any symptoms develop.

## 2019-03-05 DIAGNOSIS — Z471 Aftercare following joint replacement surgery: Secondary | ICD-10-CM | POA: Diagnosis not present

## 2019-03-05 DIAGNOSIS — T8450XA Infection and inflammatory reaction due to unspecified internal joint prosthesis, initial encounter: Secondary | ICD-10-CM | POA: Diagnosis not present

## 2019-03-05 DIAGNOSIS — M25612 Stiffness of left shoulder, not elsewhere classified: Secondary | ICD-10-CM | POA: Diagnosis not present

## 2019-03-05 DIAGNOSIS — M19012 Primary osteoarthritis, left shoulder: Secondary | ICD-10-CM | POA: Diagnosis not present

## 2019-03-05 DIAGNOSIS — Z96612 Presence of left artificial shoulder joint: Secondary | ICD-10-CM | POA: Diagnosis not present

## 2019-03-08 ENCOUNTER — Other Ambulatory Visit: Payer: Self-pay

## 2019-03-08 ENCOUNTER — Encounter: Payer: Self-pay | Admitting: Infectious Disease

## 2019-03-08 ENCOUNTER — Telehealth: Payer: Self-pay | Admitting: *Deleted

## 2019-03-08 ENCOUNTER — Ambulatory Visit (INDEPENDENT_AMBULATORY_CARE_PROVIDER_SITE_OTHER): Payer: BC Managed Care – PPO | Admitting: Infectious Disease

## 2019-03-08 VITALS — BP 114/72 | HR 71 | Temp 98.0°F

## 2019-03-08 DIAGNOSIS — Z96619 Presence of unspecified artificial shoulder joint: Secondary | ICD-10-CM | POA: Diagnosis not present

## 2019-03-08 DIAGNOSIS — Y838 Other surgical procedures as the cause of abnormal reaction of the patient, or of later complication, without mention of misadventure at the time of the procedure: Secondary | ICD-10-CM | POA: Diagnosis not present

## 2019-03-08 DIAGNOSIS — T8459XD Infection and inflammatory reaction due to other internal joint prosthesis, subsequent encounter: Secondary | ICD-10-CM | POA: Diagnosis not present

## 2019-03-08 DIAGNOSIS — A498 Other bacterial infections of unspecified site: Secondary | ICD-10-CM

## 2019-03-08 DIAGNOSIS — Z9889 Other specified postprocedural states: Secondary | ICD-10-CM | POA: Diagnosis not present

## 2019-03-08 DIAGNOSIS — T847XXA Infection and inflammatory reaction due to other internal orthopedic prosthetic devices, implants and grafts, initial encounter: Secondary | ICD-10-CM | POA: Diagnosis not present

## 2019-03-08 DIAGNOSIS — T8450XA Infection and inflammatory reaction due to unspecified internal joint prosthesis, initial encounter: Secondary | ICD-10-CM | POA: Diagnosis not present

## 2019-03-08 MED ORDER — DOXYCYCLINE HYCLATE 100 MG PO TABS: 100.0000 mg | ORAL_TABLET | Freq: Two times a day (BID) | ORAL | 4 refills | Status: DC

## 2019-03-08 NOTE — Telephone Encounter (Signed)
Lab called to report that patient Vanc trough is 33.0 drawn today 03/08/19. Advised her will let the provider know and someone will call back once he responds.

## 2019-03-08 NOTE — Progress Notes (Signed)
Subjective:    Patient ID: Patrick [name], male    DOB: April 21, 1956, 63 y.o.   MRN: [id number]  Chief complaint follow-up for prosthetic shoulder infection  HPI  Patrick [name] is a 63 year old Caucasian man with past medical history significant for multiple orthopedic procedures.  He has a background in martial arts and also is very active as a cross fitter.  Most significantly he was suffering from end-stage left shoulder arthritis after previous surgeries including a car transfer 30 years ago and a subsequent open rotator cuff repair about 20 years ago.  Patient did have some concerns for his cuff integrity prior to surgery and additionally he had some potential wound healing concerns after his initial surgery however his labs were all normal.    He proceeded with surgery with Dr. Griffin Basil  In OR Dr. Griffin Basil found significant degenerative changes that made it impossible to do a total shoulder arthroplasty so instead he proceeded with a reverse total shoulder arthroplasty.  He did remove 1 of the screws from prior surgery.  Multiple cultures were taken and these of all come back with Propionibacterium acnes.  The patient had been on doxycycline postoperatively.  He has a suppose a penicillin allergy which he does not recall but which his mother told him he had when he was a child and from which he "almost died" on talk to the patient he never was hospitalized but he does not recall the nature of the allergic reaction.  I can find no documentation of him receiving a cephalosporin or amoxicillin or penicillin in our system.  I called [facility] where he receives his primary care and they found no record in the last 10 years of him ever receiving a beta-lactam.  This is been unfortunate because I would prefer to proceed with a beta-lactam such as ceftriaxone for treatment.  I placed him on Zyvox and rifampin and then got have PICC line placed and initiated vancomycin which is due  to and in early August.  He did not tolerate the rifampin unfortunately with severe headache severe gastroesophageal reflux disease and overall malaise.  Once he stopped it after a few days the symptoms subsided  States that his shoulder is doing "great and he has no shoulder pain whatsoever.      Past Medical History:  Diagnosis Date  . ACL sprain 11/2014   right  . Allergy   . Arthritis    shoulders,  . Basal cell carcinoma   . BPH (benign prostatic hyperplasia)    high PSA level  . Chronic lower back pain   . Dental crowns present   . Diverticulosis   . GERD (gastroesophageal reflux disease)   . History of colon polyps   . History of umbilical hernia   . Plantar fasciitis   . Propionibacterium infection 02/04/2019  . Prosthetic shoulder infection ([facility]) 02/04/2019  . Seasonal allergies   . Tendonitis     Past Surgical History:  Procedure Laterality Date  . ARTHROSCOPY WITH ANTERIOR CRUCIATE LIGAMENT (ACL) REPAIR WITH ANTERIOR TIBILIAS GRAFT Right 12/08/2014   Procedure: RIGHT KNEE ARTHROSCOPY WITH ANTERIOR CRUCIATE LIGAMENT (ACL) REPAIR;  Surgeon: Kathryne Hitch, MD;  Location: [facility];  Service: Orthopedics;  Laterality: Right;  . CHONDROPLASTY Right 12/08/2014   Procedure: CHONDROPLASTY PATELLO-FEMORAL JOINT;  Surgeon: Kathryne Hitch, MD;  Location: [facility];  Service: Orthopedics;  Laterality: Right;  . COLONOSCOPY  2020  . COLONOSCOPY WITH PROPOFOL  10/15/2012  .  HERNIA REPAIR    . KNEE ARTHROSCOPY Right 09/23/2002  . KNEE ARTHROSCOPY W/ ACL RECONSTRUCTION Right 01/29/2007  . KNEE ARTHROSCOPY WITH DRILLING/MICROFRACTURE Right 12/08/2014   Procedure: DRILLING/MICROFRACTURE PATELLO-FEMORAL JOINT;  Surgeon: Kathryne Hitch, MD;  Location: [facility];  Service: Orthopedics;  Laterality: Right;  . KNEE ARTHROSCOPY WITH LATERAL MENISECTOMY Right 12/08/2014   Procedure: PARTIAL LATERAL MENISECTOMY;  Surgeon: Kathryne Hitch, MD;  Location:  [facility];  Service: Orthopedics;  Laterality: Right;  . LEG SURGERY Right 1994   [facility]  . LESION REMOVAL Right 01/28/2013   Procedure: BASAL CELL CARCINOMA REMOVAL RIGHT LOWER LIP AND RIGHT UPPER CHEEK WITH FROZEN SECTION X2;  Surgeon: Charlene Brooke, MD;  Location: [facility];  Service: Plastics;  Laterality: Right;  . REVERSE SHOULDER ARTHROPLASTY Left 01/27/2019   Procedure: REVERSE SHOULDER ARTHROPLASTY;  Surgeon: Hiram Gash, MD;  Location: [facility];  Service: Orthopedics;  Laterality: Left;  . rotator cuff removal     left  . UMBILICAL HERNIA REPAIR      Family History  Problem Relation Age of Onset  . Colon cancer Neg Hx   . Colon polyps Neg Hx   . Esophageal cancer Neg Hx   . Rectal cancer Neg Hx   . Stomach cancer Neg Hx       Social History   Socioeconomic History  . Marital status: Married    Spouse name: Not on file  . Number of children: Not on file  . Years of education: Not on file  . Highest education level: Not on file  Occupational History  . Not on file  Social Needs  . Financial resource strain: Not on file  . Food insecurity    Worry: Not on file    Inability: Not on file  . Transportation needs    Medical: Not on file    Non-medical: Not on file  Tobacco Use  . Smoking status: Never Smoker  . Smokeless tobacco: Never Used  Substance and Sexual Activity  . Alcohol use: Yes    Comment: occ beer  . Drug use: No  . Sexual activity: Yes  Lifestyle  . Physical activity    Days per week: Not on file    Minutes per session: Not on file  . Stress: Not on file  Relationships  . Social Herbalist on phone: Not on file    Gets together: Not on file    Attends religious service: Not on file    Active member of club or organization: Not on file    Attends meetings of clubs or organizations: Not on file    Relationship status: Not on file  Other Topics Concern  . Not on file  Social History Narrative  .  Not on file    Allergies  Allergen Reactions  . Penicillins Anaphylaxis    Did it involve swelling of the face/tongue/throat, SOB, or low BP? Yes Did it involve sudden or severe rash/hives, skin peeling, or any reaction on the inside of your mouth or nose? No Did you need to seek medical attention at a hospital or doctor's office? Yes When did it last happen?Childhood allergy If all above answers are "NO", may proceed with cephalosporin use.      Current Outpatient Medications:  .  Cetirizine HCl (ZYRTEC PO), Take 10 mg by mouth daily as needed (allergies). , Disp: , Rfl:  .  diclofenac (VOLTAREN) 50 MG EC tablet, Take 1  tablet (50 mg total) by mouth 2 (two) times daily., Disp: 60 tablet, Rfl: 0 .  linezolid (ZYVOX) 600 MG tablet, Take 1 tablet (600 mg total) by mouth 2 (two) times daily., Disp: 28 tablet, Rfl: 1 .  omeprazole (PRILOSEC) 20 MG capsule, Take 20 mg by mouth daily., Disp: , Rfl:  .  testosterone cypionate (DEPOTESTOSTERONE CYPIONATE) 200 MG/ML injection, Inject 200 mg into the muscle every 14 (fourteen) days. , Disp: , Rfl:  .  UNABLE TO FIND, Take 650 mg by mouth daily. Infla 650, Disp: , Rfl:   Review of Systems  Constitutional: Negative for chills and fever.  HENT: Negative for congestion and sore throat.   Eyes: Negative for photophobia.  Respiratory: Negative for cough, shortness of breath and wheezing.   Cardiovascular: Negative for chest pain, palpitations and leg swelling.  Gastrointestinal: Negative for abdominal pain, blood in stool, constipation, diarrhea, nausea and vomiting.  Genitourinary: Negative for dysuria, flank pain and hematuria.  Musculoskeletal: Negative for arthralgias, back pain, joint swelling and myalgias.  Skin: Negative for rash.  Neurological: Negative for dizziness, weakness and headaches.  Hematological: Does not bruise/bleed easily.  Psychiatric/Behavioral: Negative for agitation, behavioral problems, confusion, decreased  concentration, dysphoric mood, hallucinations and suicidal ideas. The patient is not nervous/anxious and is not hyperactive.        Objective:   Physical Exam Constitutional:      General: He is not in acute distress.    Appearance: He is not diaphoretic.  HENT:     Head: Normocephalic and atraumatic.     Right Ear: External ear normal.     Left Ear: External ear normal.     Nose: Nose normal.     Mouth/Throat:     Pharynx: No oropharyngeal exudate.  Eyes:     General: No scleral icterus.    Conjunctiva/sclera: Conjunctivae normal.     Pupils: Pupils are equal, round, and reactive to light.  Neck:     Musculoskeletal: Normal range of motion and neck supple.  Cardiovascular:     Rate and Rhythm: Normal rate and regular rhythm.     Heart sounds: Normal heart sounds.  Pulmonary:     Effort: Pulmonary effort is normal. No respiratory distress.     Breath sounds: No wheezing.  Abdominal:     General: There is no distension.     Palpations: Abdomen is soft.  Musculoskeletal:        General: No tenderness.  Lymphadenopathy:     Cervical: No cervical adenopathy.  Skin:    General: Skin is warm and dry.     Coloration: Skin is not pale.     Findings: No erythema or rash.  Neurological:     General: No focal deficit present.     Mental Status: He is alert and oriented to person, place, and time.     Coordination: Coordination normal.  Psychiatric:        Mood and Affect: Mood normal.        Behavior: Behavior normal.        Thought Content: Thought content normal.        Judgment: Judgment normal.     Left shoulder is in sling      Assessment & Plan:  Prosthetic joint infection with Propionibacterium acnes:yvox 600 mg twice daily along with rifampin 300 mg twice daily which she can start after he is first gotten used to taking the Zyvox.  He will finish his IV vancomycin and then I  will change him over to oral doxycycline which I would like him to take for an  additional 5 months.  I plan on seeing him in 2 months for follow-up

## 2019-03-08 NOTE — Telephone Encounter (Signed)
Called [facility] to advise to hold vanc and redo CMP tomorrow and Thursday. Spoke with Amy, pharmacist,  and she advised she will do so. Amy is calling the patient to inform.

## 2019-03-08 NOTE — Telephone Encounter (Signed)
Please hold vancomycin recheck metabolic panel tomorrow, and on Thursday  Would continue to hold the vancomycin and recheck it later in the week perhaps on Thursday

## 2019-03-09 ENCOUNTER — Telehealth: Payer: Self-pay

## 2019-03-09 NOTE — Telephone Encounter (Signed)
Per Dr. Tommy Medal called [facility] with verbal order to pull picc line after last dose. Spoke with Stanton Kidney at [facility] who was able to take verbal order.  [place]

## 2019-03-10 DIAGNOSIS — Z96619 Presence of unspecified artificial shoulder joint: Secondary | ICD-10-CM | POA: Diagnosis not present

## 2019-03-10 DIAGNOSIS — T8450XA Infection and inflammatory reaction due to unspecified internal joint prosthesis, initial encounter: Secondary | ICD-10-CM | POA: Diagnosis not present

## 2019-03-10 DIAGNOSIS — Y838 Other surgical procedures as the cause of abnormal reaction of the patient, or of later complication, without mention of misadventure at the time of the procedure: Secondary | ICD-10-CM | POA: Diagnosis not present

## 2019-03-10 DIAGNOSIS — T847XXD Infection and inflammatory reaction due to other internal orthopedic prosthetic devices, implants and grafts, subsequent encounter: Secondary | ICD-10-CM | POA: Diagnosis not present

## 2019-03-12 DIAGNOSIS — T8450XA Infection and inflammatory reaction due to unspecified internal joint prosthesis, initial encounter: Secondary | ICD-10-CM | POA: Diagnosis not present

## 2019-03-15 DIAGNOSIS — Z96611 Presence of right artificial shoulder joint: Secondary | ICD-10-CM | POA: Diagnosis not present

## 2019-03-15 DIAGNOSIS — T847XXA Infection and inflammatory reaction due to other internal orthopedic prosthetic devices, implants and grafts, initial encounter: Secondary | ICD-10-CM | POA: Diagnosis not present

## 2019-03-15 DIAGNOSIS — T8450XA Infection and inflammatory reaction due to unspecified internal joint prosthesis, initial encounter: Secondary | ICD-10-CM | POA: Diagnosis not present

## 2019-03-15 DIAGNOSIS — Y838 Other surgical procedures as the cause of abnormal reaction of the patient, or of later complication, without mention of misadventure at the time of the procedure: Secondary | ICD-10-CM | POA: Diagnosis not present

## 2019-03-18 DIAGNOSIS — M25612 Stiffness of left shoulder, not elsewhere classified: Secondary | ICD-10-CM | POA: Diagnosis not present

## 2019-03-18 DIAGNOSIS — Z96612 Presence of left artificial shoulder joint: Secondary | ICD-10-CM | POA: Diagnosis not present

## 2019-03-18 DIAGNOSIS — T8450XA Infection and inflammatory reaction due to unspecified internal joint prosthesis, initial encounter: Secondary | ICD-10-CM | POA: Diagnosis not present

## 2019-03-18 DIAGNOSIS — M6281 Muscle weakness (generalized): Secondary | ICD-10-CM | POA: Diagnosis not present

## 2019-03-18 DIAGNOSIS — M19012 Primary osteoarthritis, left shoulder: Secondary | ICD-10-CM | POA: Diagnosis not present

## 2019-03-18 DIAGNOSIS — T847XXA Infection and inflammatory reaction due to other internal orthopedic prosthetic devices, implants and grafts, initial encounter: Secondary | ICD-10-CM | POA: Diagnosis not present

## 2019-03-18 DIAGNOSIS — Y838 Other surgical procedures as the cause of abnormal reaction of the patient, or of later complication, without mention of misadventure at the time of the procedure: Secondary | ICD-10-CM | POA: Diagnosis not present

## 2019-03-18 DIAGNOSIS — Z96611 Presence of right artificial shoulder joint: Secondary | ICD-10-CM | POA: Diagnosis not present

## 2019-03-19 DIAGNOSIS — T8450XA Infection and inflammatory reaction due to unspecified internal joint prosthesis, initial encounter: Secondary | ICD-10-CM | POA: Diagnosis not present

## 2019-03-24 DIAGNOSIS — Z85828 Personal history of other malignant neoplasm of skin: Secondary | ICD-10-CM | POA: Diagnosis not present

## 2019-03-24 DIAGNOSIS — L814 Other melanin hyperpigmentation: Secondary | ICD-10-CM | POA: Diagnosis not present

## 2019-03-24 DIAGNOSIS — D225 Melanocytic nevi of trunk: Secondary | ICD-10-CM | POA: Diagnosis not present

## 2019-03-24 DIAGNOSIS — C4441 Basal cell carcinoma of skin of scalp and neck: Secondary | ICD-10-CM | POA: Diagnosis not present

## 2019-03-24 DIAGNOSIS — L821 Other seborrheic keratosis: Secondary | ICD-10-CM | POA: Diagnosis not present

## 2019-03-25 DIAGNOSIS — M19012 Primary osteoarthritis, left shoulder: Secondary | ICD-10-CM | POA: Diagnosis not present

## 2019-03-25 DIAGNOSIS — M6281 Muscle weakness (generalized): Secondary | ICD-10-CM | POA: Diagnosis not present

## 2019-03-25 DIAGNOSIS — M25612 Stiffness of left shoulder, not elsewhere classified: Secondary | ICD-10-CM | POA: Diagnosis not present

## 2019-03-25 DIAGNOSIS — Z96612 Presence of left artificial shoulder joint: Secondary | ICD-10-CM | POA: Diagnosis not present

## 2019-04-01 DIAGNOSIS — M6281 Muscle weakness (generalized): Secondary | ICD-10-CM | POA: Diagnosis not present

## 2019-04-01 DIAGNOSIS — Z96612 Presence of left artificial shoulder joint: Secondary | ICD-10-CM | POA: Diagnosis not present

## 2019-04-01 DIAGNOSIS — M25612 Stiffness of left shoulder, not elsewhere classified: Secondary | ICD-10-CM | POA: Diagnosis not present

## 2019-04-01 DIAGNOSIS — M19012 Primary osteoarthritis, left shoulder: Secondary | ICD-10-CM | POA: Diagnosis not present

## 2019-04-08 DIAGNOSIS — Z96612 Presence of left artificial shoulder joint: Secondary | ICD-10-CM | POA: Diagnosis not present

## 2019-04-08 DIAGNOSIS — M19012 Primary osteoarthritis, left shoulder: Secondary | ICD-10-CM | POA: Diagnosis not present

## 2019-04-08 DIAGNOSIS — M25612 Stiffness of left shoulder, not elsewhere classified: Secondary | ICD-10-CM | POA: Diagnosis not present

## 2019-04-08 DIAGNOSIS — M6281 Muscle weakness (generalized): Secondary | ICD-10-CM | POA: Diagnosis not present

## 2019-04-13 DIAGNOSIS — M19012 Primary osteoarthritis, left shoulder: Secondary | ICD-10-CM | POA: Diagnosis not present

## 2019-04-29 DIAGNOSIS — M19012 Primary osteoarthritis, left shoulder: Secondary | ICD-10-CM | POA: Diagnosis not present

## 2019-04-29 DIAGNOSIS — Z96612 Presence of left artificial shoulder joint: Secondary | ICD-10-CM | POA: Diagnosis not present

## 2019-04-29 DIAGNOSIS — M25612 Stiffness of left shoulder, not elsewhere classified: Secondary | ICD-10-CM | POA: Diagnosis not present

## 2019-04-29 DIAGNOSIS — M6281 Muscle weakness (generalized): Secondary | ICD-10-CM | POA: Diagnosis not present

## 2019-05-10 ENCOUNTER — Telehealth: Payer: Self-pay

## 2019-05-10 NOTE — Telephone Encounter (Signed)
COVID-19 Pre-Screening Questions:05/10/19  Do you currently have a fever (>100 F), chills or unexplained body aches? NO  Are you currently experiencing new cough, shortness of breath, sore throat, runny nose? NO .  Have you recently travelled outside the state of [place] in the last 14 days? NO .  Have you been in contact with someone that is currently pending confirmation of Covid19 testing or has been confirmed to have the [place] virus? NO  **If the patient answers NO to ALL questions -  advise the patient to please call the clinic before coming to the office should any symptoms develop.

## 2019-05-11 ENCOUNTER — Encounter: Payer: Self-pay | Admitting: Infectious Disease

## 2019-05-11 ENCOUNTER — Ambulatory Visit (INDEPENDENT_AMBULATORY_CARE_PROVIDER_SITE_OTHER): Payer: BC Managed Care – PPO | Admitting: Infectious Disease

## 2019-05-11 ENCOUNTER — Other Ambulatory Visit: Payer: Self-pay

## 2019-05-11 DIAGNOSIS — A498 Other bacterial infections of unspecified site: Secondary | ICD-10-CM

## 2019-05-11 DIAGNOSIS — M19212 Secondary osteoarthritis, left shoulder: Secondary | ICD-10-CM

## 2019-05-11 DIAGNOSIS — Z96619 Presence of unspecified artificial shoulder joint: Secondary | ICD-10-CM | POA: Diagnosis not present

## 2019-05-11 DIAGNOSIS — T8459XD Infection and inflammatory reaction due to other internal joint prosthesis, subsequent encounter: Secondary | ICD-10-CM | POA: Diagnosis not present
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
## 2019-05-14 IMAGING — CT CT ABD-PELV W/ CM
2 of 5 series · 16 of 46 positions shown, 18 images · IV contrast (APPLIED)
Comparison: CT scan January 06, 2012

CLINICAL DATA: History of umbilical hernia for 3 months. Worse
today.

EXAM:
CT ABDOMEN AND PELVIS WITH CONTRAST
TECHNIQUE: Multidetector CT imaging of the abdomen and pelvis was performed
using the standard protocol following bolus administration of
intravenous contrast.
CONTRAST:  100mL [id number] IOPAMIDOL ([id number]) INJECTION 61%

[Series 2: axial st · axial · 0.84mm/px · z∈[+140,+615]mm · 13 of 107 slices shown, 15 images]
[im 6/107  soft-tissue]
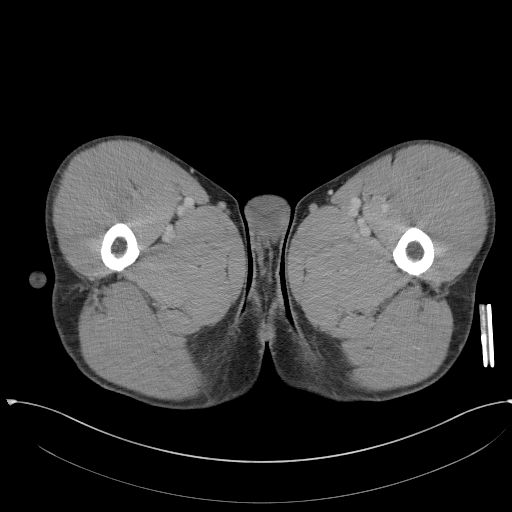
[im 6/107  bone]
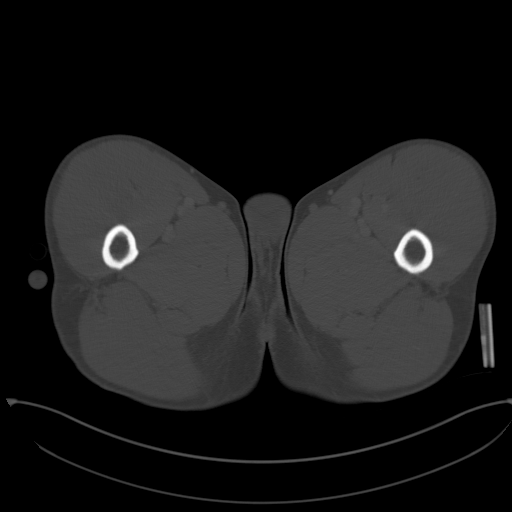
[im 12/107  soft-tissue]
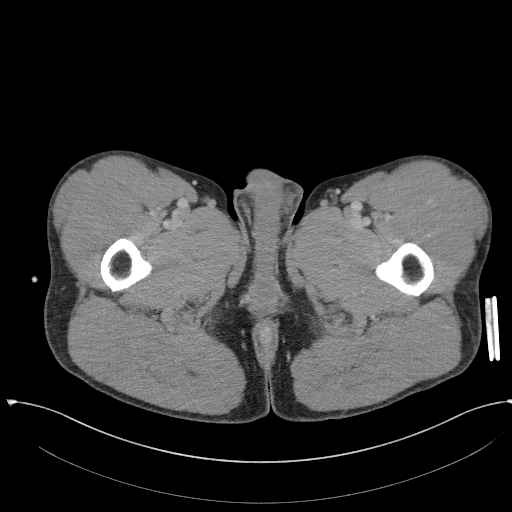
[im 24/107  soft-tissue]
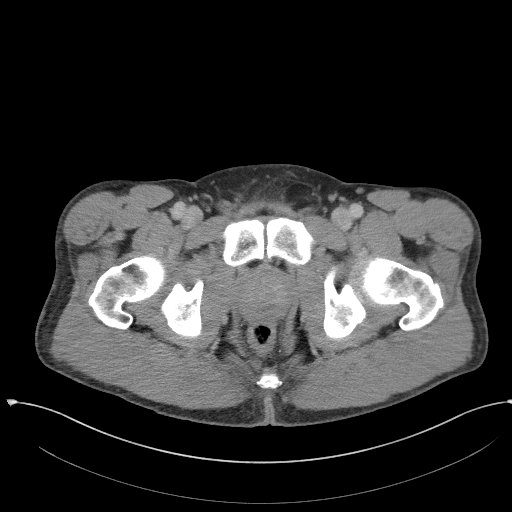
[im 30/107  soft-tissue]
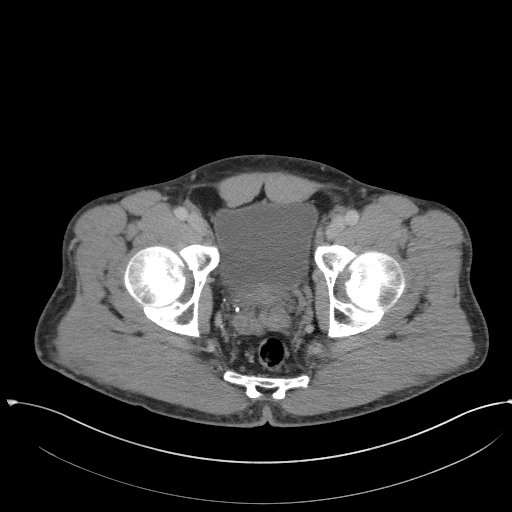
[im 36/107  soft-tissue]
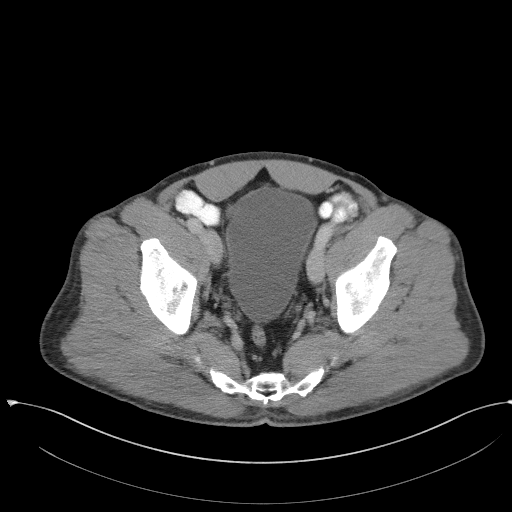
[im 48/107  soft-tissue]
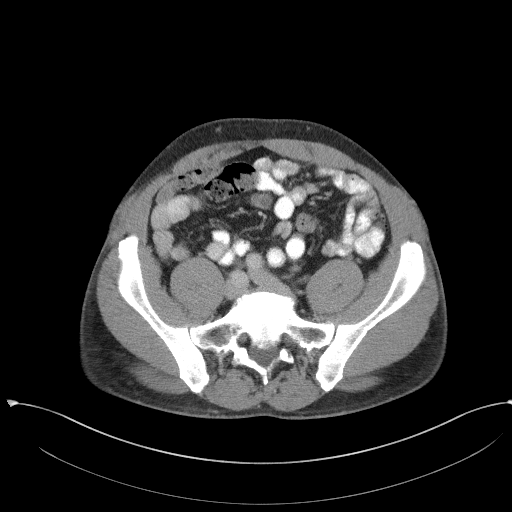
[im 54/107  soft-tissue]
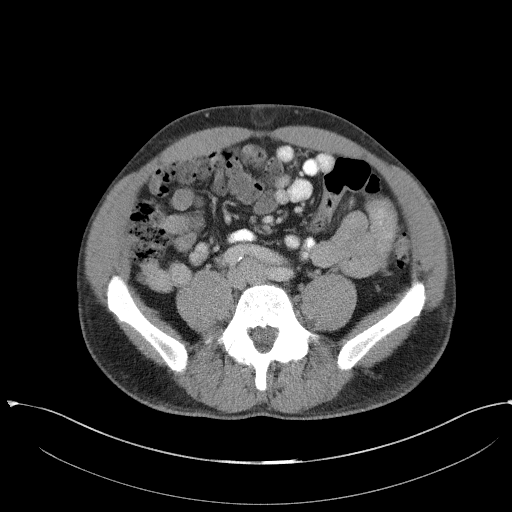
[im 59/107  soft-tissue]
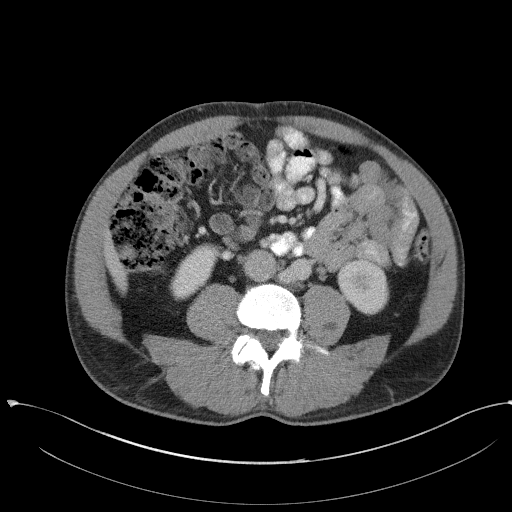
[im 71/107  soft-tissue]
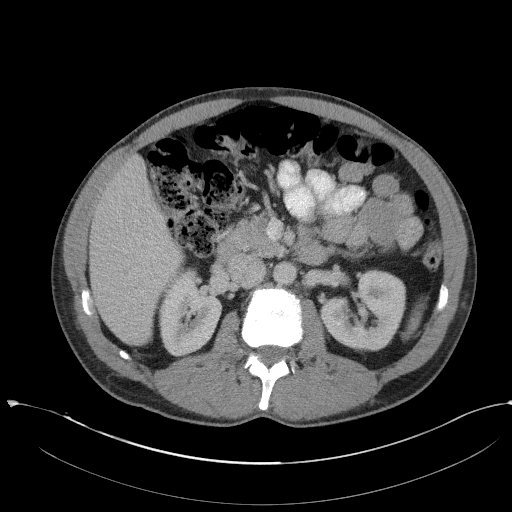
[im 71/107  bone]
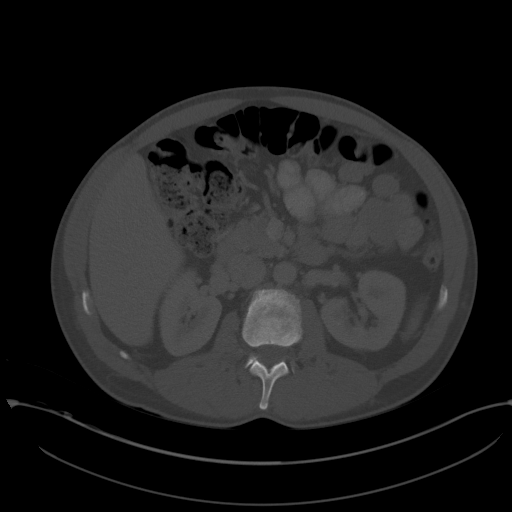
[im 77/107  soft-tissue]
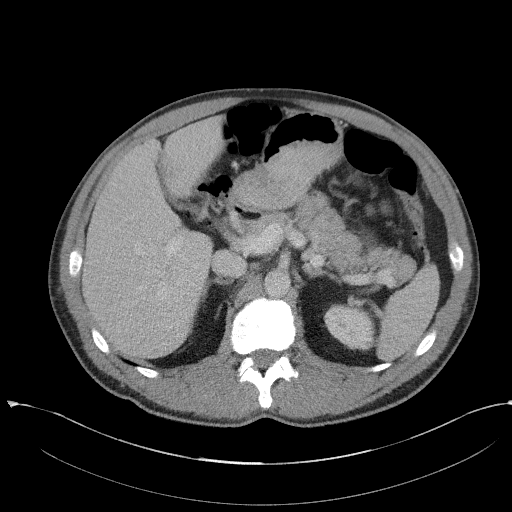
[im 83/107  soft-tissue]
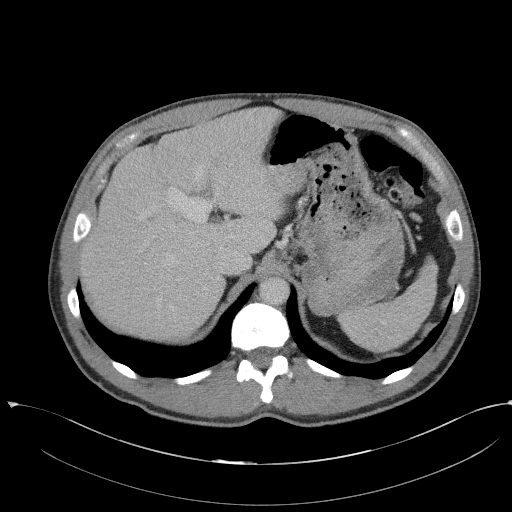
[im 95/107  soft-tissue]
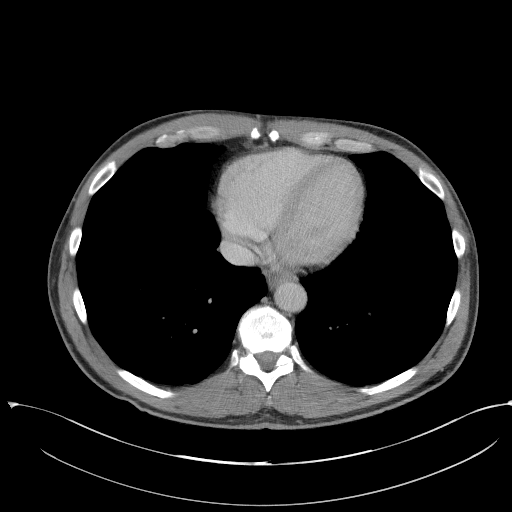
[im 101/107  soft-tissue]
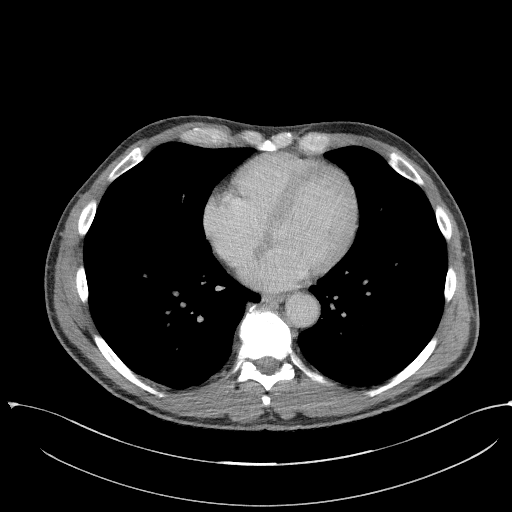

[Series 5: coronal st · coronal · 0.80mm/px · 3 of 101 slices shown]
[im 34/101  soft-tissue]
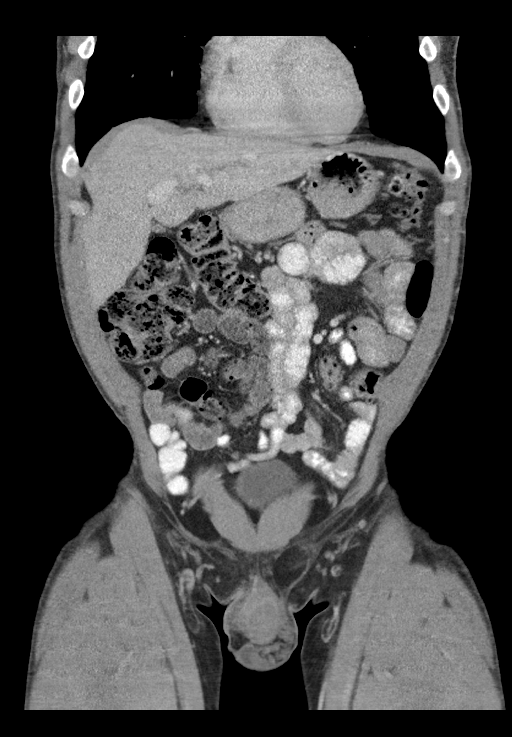
[im 45/101  soft-tissue]
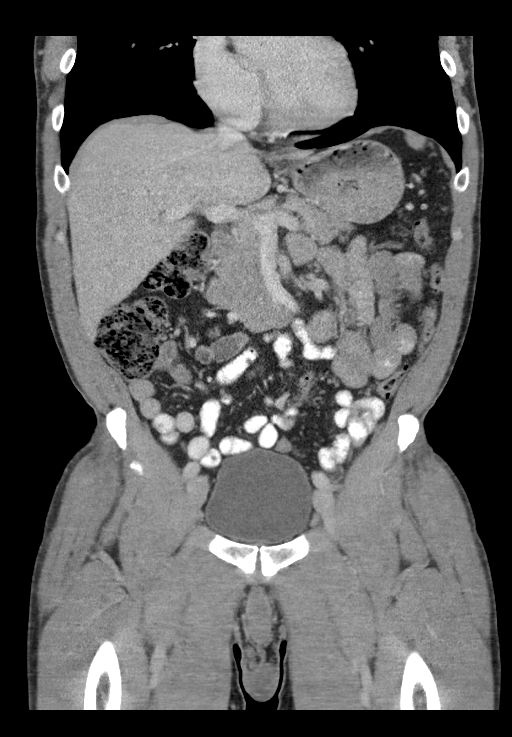
[im 56/101  soft-tissue]
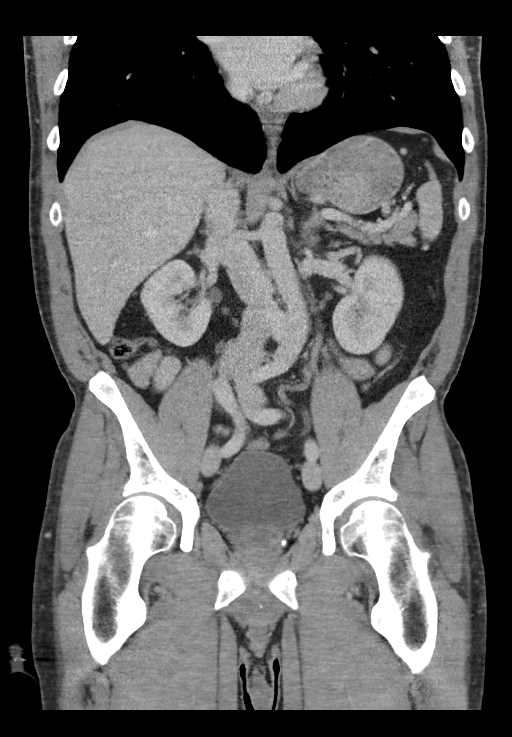

[16 of 46 positions shown; findings below may reference images not displayed]

FINDINGS: Lower chest: No acute abnormality.

Hepatobiliary: No focal liver abnormality is seen. No gallstones,
gallbladder wall thickening, or biliary dilatation.

Pancreas: Unremarkable. No pancreatic ductal dilatation or
surrounding inflammatory changes.

Spleen: Normal in size without focal abnormality.

Adrenals/Urinary Tract: Adrenal glands are normal. The kidneys and
ureters are normal. The bladder is unremarkable.

Stomach/Bowel: The stomach and small bowel are normal. Scattered
colonic diverticuli are seen without diverticulitis. Fecal loading
in the colon. The visualized appendix is normal.

Vascular/Lymphatic: No significant vascular findings are present. No
enlarged abdominal or pelvic lymph nodes.

Reproductive: Prostate is unremarkable.

Other: There is a fat containing umbilical hernia which is larger
when compared to Sunday December, 2011. No free air or free fluid.

Musculoskeletal: No acute or significant osseous findings.
IMPRESSION: 1. The fat containing umbilical hernia is larger in the interval.
2. Scattered colonic diverticuli without diverticulitis.
3. Fecal loading in the proximal colon.
4. No other acute abnormalities.

## 2019-05-27 DIAGNOSIS — T1592XA Foreign body on external eye, part unspecified, left eye, initial encounter: Secondary | ICD-10-CM | POA: Diagnosis not present

## 2019-07-13 ENCOUNTER — Telehealth: Payer: Self-pay

## 2019-07-13 NOTE — Telephone Encounter (Signed)
COVID-19 Pre-Screening Questions:07/13/19   Do you currently have a fever (>100 F), chills or unexplained body aches?NO  Are you currently experiencing new cough, shortness of breath, sore throat, runny nose? NO  .  Have you recently travelled outside the state of [place] in the last 14 days? NO .  Have you been in contact with someone that is currently pending confirmation of Covid19 testing or has been confirmed to have the Covid19 virus?  NO  **If the patient answers NO to ALL questions -  advise the patient to please call the clinic before coming to the office should any symptoms develop.     

## 2019-07-14 ENCOUNTER — Ambulatory Visit: Payer: BC Managed Care – PPO | Admitting: Infectious Disease

## 2019-07-15 DIAGNOSIS — Z20828 Contact with and (suspected) exposure to other viral communicable diseases: Secondary | ICD-10-CM | POA: Diagnosis not present

## 2019-07-27 ENCOUNTER — Other Ambulatory Visit: Payer: Self-pay

## 2019-07-27 DIAGNOSIS — M19012 Primary osteoarthritis, left shoulder: Secondary | ICD-10-CM | POA: Diagnosis not present

## 2019-07-27 NOTE — Telephone Encounter (Signed)
Patient called to see if he needs to continue Doxycycline 100 mgs twice daily until August 18, 2018 for continued shoulder pain. He was told to call by his surgeon.     Please advise.   Laverle Patter, RN

## 2019-07-28 NOTE — Telephone Encounter (Signed)
Yes lets continue the doxycycline until I see him in person

## 2019-07-29 ENCOUNTER — Other Ambulatory Visit: Payer: Self-pay

## 2019-07-29 MED ORDER — DOXYCYCLINE HYCLATE 100 MG PO TABS: 100.0000 mg | ORAL_TABLET | Freq: Two times a day (BID) | ORAL | 0 refills | Status: DC

## 2019-07-29 NOTE — Telephone Encounter (Signed)
Patient made aware to continue Doxycycline until seen in person. Refill sent to [facility]

## 2019-07-29 NOTE — Telephone Encounter (Signed)
Medication refill sent. Patient made aware.

## 2019-07-29 NOTE — Telephone Encounter (Signed)
perfect

## 2019-08-19 ENCOUNTER — Encounter: Payer: Self-pay | Admitting: Infectious Disease

## 2019-08-19 ENCOUNTER — Other Ambulatory Visit: Payer: Self-pay

## 2019-08-19 ENCOUNTER — Ambulatory Visit (INDEPENDENT_AMBULATORY_CARE_PROVIDER_SITE_OTHER): Payer: BC Managed Care – PPO | Admitting: Infectious Disease

## 2019-08-19 VITALS — BP 124/78 | HR 73 | Temp 97.6°F | Ht 73.0 in | Wt 197.0 lb

## 2019-08-19 DIAGNOSIS — Z96619 Presence of unspecified artificial shoulder joint: Secondary | ICD-10-CM

## 2019-08-19 DIAGNOSIS — A498 Other bacterial infections of unspecified site: Secondary | ICD-10-CM

## 2019-08-19 DIAGNOSIS — U071 COVID-19: Secondary | ICD-10-CM

## 2019-08-19 DIAGNOSIS — T8459XD Infection and inflammatory reaction due to other internal joint prosthesis, subsequent encounter: Secondary | ICD-10-CM

## 2019-08-19 DIAGNOSIS — Z9889 Other specified postprocedural states: Secondary | ICD-10-CM | POA: Diagnosis not present

## 2019-08-19 HISTORY — DX: COVID-19: U07.1

## 2019-08-19 MED ORDER — DOXYCYCLINE HYCLATE 100 MG PO TABS: 100.0000 mg | ORAL_TABLET | Freq: Two times a day (BID) | ORAL | 5 refills | Status: DC

## 2019-08-19 NOTE — Progress Notes (Signed)
Subjective:    Patient ID: Patrick [name], male    DOB: November 09, 1955, 64 y.o.   MRN: [id number]  Chief complaint follow-up for prosthetic shoulder infection  HPI   Patrick [name] is a 64 year old Caucasian man with past medical history significant for multiple orthopedic procedures.  He has a background in martial arts and also is very active as a cross fitter.  Most significantly he was suffering from end-stage left shoulder arthritis after previous surgeries including a car transfer 30 years ago and a subsequent open rotator cuff repair about 20 years ago.  Patient did have some concerns for his cuff integrity prior to surgery and additionally he had some potential wound healing concerns after his initial surgery however his labs were all normal.    He proceeded with surgery with Dr. Griffin [name]  In OR Dr. Griffin [name] found significant degenerative changes that made it impossible to do a total shoulder arthroplasty so instead he proceeded with a reverse total shoulder arthroplasty.  He did remove 1 of the screws from prior surgery.  Multiple cultures were taken and these of all come back with Propionibacterium acnes.  The patient had been on doxycycline postoperatively.  He has a suppose a penicillin allergy which he did not recall but which his mother told him he had when he was a child and from which he "almost died" on talk to the patient he never was hospitalized but he does not recall the nature of the allergic reaction.  I can find no documentation of him receiving a cephalosporin or amoxicillin or penicillin in our system.  I called [facility] where he receives his primary care and they found no record in the last 10 years of him ever receiving a beta-lactam.  This is been unfortunate because I would prefer to proceed with a beta-lactam such as ceftriaxone for treatment.  I placed him on Zyvox and rifampin and then got have PICC line placed and initiated vancomycin .  He did not  tolerate the rifampin unfortunately with severe headache severe gastroesophageal reflux disease and overall malaise.  Once he stopped it after a few days the symptoms subsided  He completed vancomycin therapy and then went on to doxycycline.  He was doing well when I last talked him over the phone at a virtual visit.  He then came off doxycycline and had a COVID-19 infection over the Thanksgiving holidays.  He was aching all over while suffering from Covid but then afterwards began aching in his shoulder again and reinitiated doxycycline.  Pain is improved dramatically since going back on doxycycline.         Past Medical History:  Diagnosis Date  . ACL sprain 11/2014   right  . Allergy   . Arthritis    shoulders,  . Basal cell carcinoma   . BPH (benign prostatic hyperplasia)    high PSA level  . Chronic lower back pain   . COVID-19 virus infection 08/19/2019  . Dental crowns present   . Diverticulosis   . GERD (gastroesophageal reflux disease)   . History of colon polyps   . History of umbilical hernia   . Plantar fasciitis   . Propionibacterium infection 02/04/2019  . Prosthetic shoulder infection ([facility] [facility]) 02/04/2019  . Seasonal allergies   . Tendonitis     Past Surgical History:  Procedure Laterality Date  . ARTHROSCOPY WITH ANTERIOR CRUCIATE LIGAMENT (ACL) REPAIR WITH ANTERIOR TIBILIAS GRAFT Right 12/08/2014   Procedure: RIGHT KNEE ARTHROSCOPY  WITH ANTERIOR CRUCIATE LIGAMENT (ACL) REPAIR;  Surgeon: Kathryne Hitch, MD;  Location: [facility];  Service: Orthopedics;  Laterality: Right;  . CHONDROPLASTY Right 12/08/2014   Procedure: CHONDROPLASTY PATELLO-FEMORAL JOINT;  Surgeon: Kathryne Hitch, MD;  Location: [facility];  Service: Orthopedics;  Laterality: Right;  . COLONOSCOPY  2020  . COLONOSCOPY WITH PROPOFOL  10/15/2012  . HERNIA REPAIR    . KNEE ARTHROSCOPY Right 09/23/2002  . KNEE ARTHROSCOPY W/ ACL RECONSTRUCTION Right 01/29/2007  . KNEE  ARTHROSCOPY WITH DRILLING/MICROFRACTURE Right 12/08/2014   Procedure: DRILLING/MICROFRACTURE PATELLO-FEMORAL JOINT;  Surgeon: Kathryne Hitch, MD;  Location: [place];  Service: Orthopedics;  Laterality: Right;  . KNEE ARTHROSCOPY WITH LATERAL MENISECTOMY Right 12/08/2014   Procedure: PARTIAL LATERAL MENISECTOMY;  Surgeon: Kathryne Hitch, MD;  Location: [place];  Service: Orthopedics;  Laterality: Right;  . LEG SURGERY Right 1994   [facility]  . LESION REMOVAL Right 01/28/2013   Procedure: BASAL CELL CARCINOMA REMOVAL RIGHT LOWER LIP AND RIGHT UPPER CHEEK WITH FROZEN SECTION X2;  Surgeon: Charlene Brooke, MD;  Location: [place];  Service: Plastics;  Laterality: Right;  . REVERSE SHOULDER ARTHROPLASTY Left 01/27/2019   Procedure: REVERSE SHOULDER ARTHROPLASTY;  Surgeon: Hiram Gash, MD;  Location: [facility];  Service: Orthopedics;  Laterality: Left;  . rotator cuff removal     left  . UMBILICAL HERNIA REPAIR      Family History  Problem Relation Age of Onset  . Colon cancer Neg Hx   . Colon polyps Neg Hx   . Esophageal cancer Neg Hx   . Rectal cancer Neg Hx   . Stomach cancer Neg Hx       Social History   Socioeconomic History  . Marital status: Married    Spouse name: Not on file  . Number of children: Not on file  . Years of education: Not on file  . Highest education level: Not on file  Occupational History  . Not on file  Tobacco Use  . Smoking status: Never Smoker  . Smokeless tobacco: Never Used  Substance and Sexual Activity  . Alcohol use: Yes    Comment: occ beer  . Drug use: No  . Sexual activity: Yes  Other Topics Concern  . Not on file  Social History Narrative  . Not on file   Social Determinants of Health   Financial Resource Strain:   . Difficulty of Paying Living Expenses: Not on file  Food Insecurity:   . Worried About Charity fundraiser in the Last Year: Not on file  . Ran Out of Food in the Last Year:  Not on file  Transportation Needs:   . Lack of Transportation (Medical): Not on file  . Lack of Transportation (Non-Medical): Not on file  Physical Activity:   . Days of Exercise per Week: Not on file  . Minutes of Exercise per Session: Not on file  Stress:   . Feeling of Stress : Not on file  Social Connections:   . Frequency of Communication with Friends and Family: Not on file  . Frequency of Social Gatherings with Friends and Family: Not on file  . Attends Religious Services: Not on file  . Active Member of Clubs or Organizations: Not on file  . Attends Archivist Meetings: Not on file  . Marital Status: Not on file    Allergies  Allergen Reactions  . Penicillins Anaphylaxis    Did it  involve swelling of the face/tongue/throat, SOB, or low BP? Yes Did it involve sudden or severe rash/hives, skin peeling, or any reaction on the inside of your mouth or nose? No Did you need to seek medical attention at a hospital or doctor's office? Yes When did it last happen?Childhood allergy If all above answers are "NO", may proceed with cephalosporin use.   . Rifampin Other (See Comments)    He had severe headaches gastroesophageal reflux disease and malaise     Current Outpatient Medications:  .  Cetirizine HCl (ZYRTEC PO), Take 10 mg by mouth daily as needed (allergies). , Disp: , Rfl:  .  diclofenac (VOLTAREN) 50 MG EC tablet, Take 1 tablet (50 mg total) by mouth 2 (two) times daily., Disp: 60 tablet, Rfl: 0 .  doxycycline (VIBRA-TABS) 100 MG tablet, Take 1 tablet (100 mg total) by mouth 2 (two) times daily., Disp: 60 tablet, Rfl: 0 .  omeprazole (PRILOSEC) 20 MG capsule, Take 20 mg by mouth daily., Disp: , Rfl:  .  testosterone cypionate (DEPOTESTOSTERONE CYPIONATE) 200 MG/ML injection, Inject 200 mg into the muscle every 14 (fourteen) days. , Disp: , Rfl:  .  UNABLE TO FIND, Take 650 mg by mouth daily. Infla 650, Disp: , Rfl:   Review of Systems  Constitutional:  Negative for chills and fever.  HENT: Negative for congestion and sore throat.   Eyes: Negative for photophobia.  Respiratory: Negative for cough, shortness of breath and wheezing.   Cardiovascular: Negative for chest pain, palpitations and leg swelling.  Gastrointestinal: Negative for abdominal pain, blood in stool, constipation, diarrhea, nausea and vomiting.  Genitourinary: Negative for dysuria, flank pain and hematuria.  Musculoskeletal: Positive for arthralgias. Negative for back pain, joint swelling and myalgias.  Skin: Negative for rash.  Neurological: Negative for dizziness, weakness and headaches.  Hematological: Does not bruise/bleed easily.  Psychiatric/Behavioral: Negative for agitation, behavioral problems, confusion, decreased concentration, dysphoric mood, hallucinations and suicidal ideas. The patient is not nervous/anxious and is not hyperactive.        Objective:   Physical Exam Constitutional:      General: He is not in acute distress.    Appearance: He is not diaphoretic.  HENT:     Head: Normocephalic and atraumatic.     Right Ear: External ear normal.     Left Ear: External ear normal.     Nose: Nose normal.     Mouth/Throat:     Pharynx: No oropharyngeal exudate.  Eyes:     General: No scleral icterus.    Conjunctiva/sclera: Conjunctivae normal.     Pupils: Pupils are equal, round, and reactive to light.  Cardiovascular:     Rate and Rhythm: Normal rate and regular rhythm.     Heart sounds: Normal heart sounds.  Pulmonary:     Effort: Pulmonary effort is normal. No respiratory distress.     Breath sounds: No wheezing.  Abdominal:     General: There is no distension.     Palpations: Abdomen is soft.  Musculoskeletal:        General: No tenderness.     Cervical back: Normal range of motion and neck supple.  Lymphadenopathy:     Cervical: No cervical adenopathy.  Skin:    General: Skin is warm and dry.     Coloration: Skin is not pale.     Findings:  No erythema or rash.  Neurological:     General: No focal deficit present.     Mental Status: He  is alert and oriented to person, place, and time.     Coordination: Coordination normal.  Psychiatric:        Mood and Affect: Mood normal.        Behavior: Behavior normal.        Thought Content: Thought content normal.        Judgment: Judgment normal.         Assessment & Plan:  Prosthetic joint infection with Propionibacterium acnes:  Check inflammatory markers today and continue doxycycline have him come back to see me in 6 months time.

## 2019-08-19 NOTE — Addendum Note (Signed)
Addended by: Landis Gandy on: 08/19/2019 10:50 AM   Modules accepted: Orders

## 2019-08-20 LAB — C-REACTIVE PROTEIN: CRP: 1.4 mg/L (ref <8.0)

## 2019-08-20 LAB — SEDIMENTATION RATE: Sed Rate: 2 mm/h (ref 0–20)

## 2019-10-06 DIAGNOSIS — Z0000 Encounter for general adult medical examination without abnormal findings: Secondary | ICD-10-CM | POA: Diagnosis not present

## 2019-10-06 DIAGNOSIS — Z125 Encounter for screening for malignant neoplasm of prostate: Secondary | ICD-10-CM | POA: Diagnosis not present

## 2019-10-06 DIAGNOSIS — R7989 Other specified abnormal findings of blood chemistry: Secondary | ICD-10-CM | POA: Diagnosis not present

## 2019-10-06 DIAGNOSIS — E7849 Other hyperlipidemia: Secondary | ICD-10-CM | POA: Diagnosis not present

## 2019-10-07 DIAGNOSIS — R5383 Other fatigue: Secondary | ICD-10-CM | POA: Diagnosis not present

## 2019-10-07 DIAGNOSIS — Z1331 Encounter for screening for depression: Secondary | ICD-10-CM | POA: Diagnosis not present

## 2019-10-07 DIAGNOSIS — H538 Other visual disturbances: Secondary | ICD-10-CM | POA: Diagnosis not present

## 2019-10-07 DIAGNOSIS — M25512 Pain in left shoulder: Secondary | ICD-10-CM | POA: Diagnosis not present

## 2019-10-07 DIAGNOSIS — Z0000 Encounter for general adult medical examination without abnormal findings: Secondary | ICD-10-CM | POA: Diagnosis not present

## 2019-10-07 DIAGNOSIS — R972 Elevated prostate specific antigen [PSA]: Secondary | ICD-10-CM | POA: Diagnosis not present

## 2019-12-01 DIAGNOSIS — H524 Presbyopia: Secondary | ICD-10-CM | POA: Diagnosis not present

## 2019-12-01 DIAGNOSIS — H1045 Other chronic allergic conjunctivitis: Secondary | ICD-10-CM | POA: Diagnosis not present

## 2019-12-01 DIAGNOSIS — H5203 Hypermetropia, bilateral: Secondary | ICD-10-CM | POA: Diagnosis not present

## 2019-12-01 DIAGNOSIS — H0288B Meibomian gland dysfunction left eye, upper and lower eyelids: Secondary | ICD-10-CM | POA: Diagnosis not present

## 2019-12-01 DIAGNOSIS — H2513 Age-related nuclear cataract, bilateral: Secondary | ICD-10-CM | POA: Diagnosis not present

## 2019-12-01 DIAGNOSIS — H0288A Meibomian gland dysfunction right eye, upper and lower eyelids: Secondary | ICD-10-CM | POA: Diagnosis not present

## 2019-12-19 IMAGING — DX LEFT SHOULDER - 1 VIEW
2 series · 2 of 2 positions shown · non-contrast
Comparison: CT scan 12/23/2018

CLINICAL DATA: Postop reversed total shoulder arthroplasty.

EXAM:
LEFT SHOULDER - 1 VIEW

[shoulder ap]
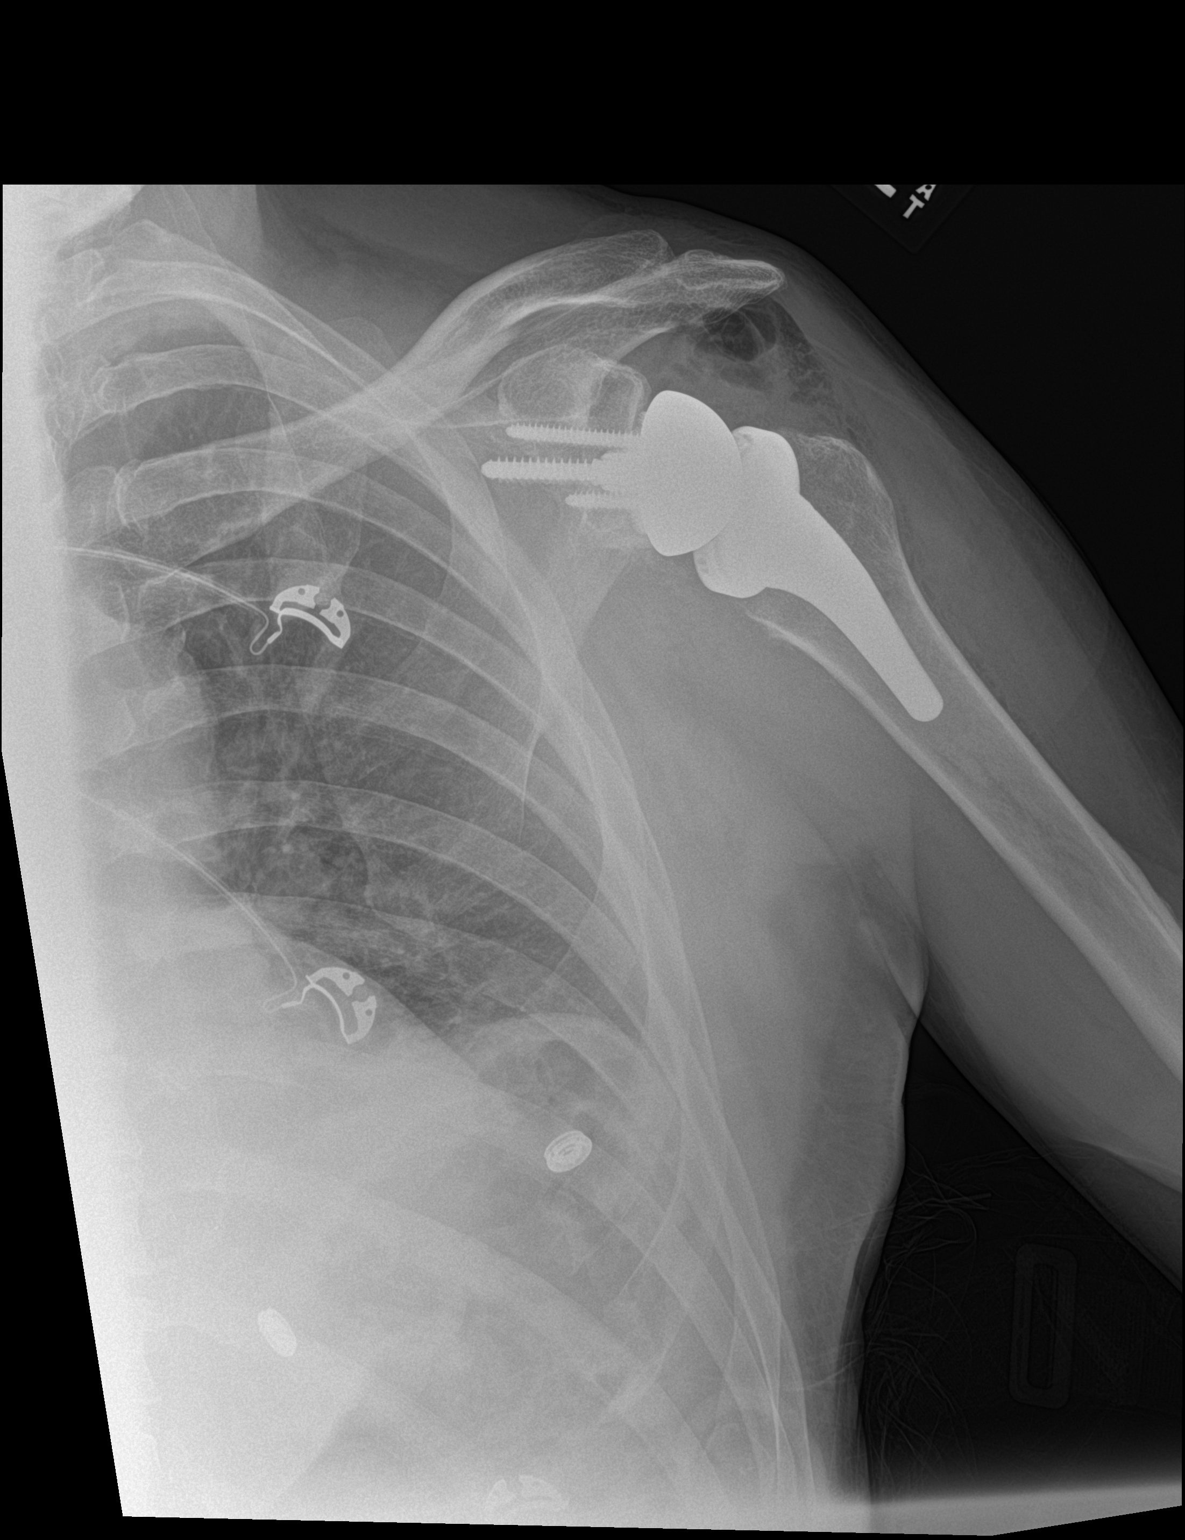

[shoulder obl]
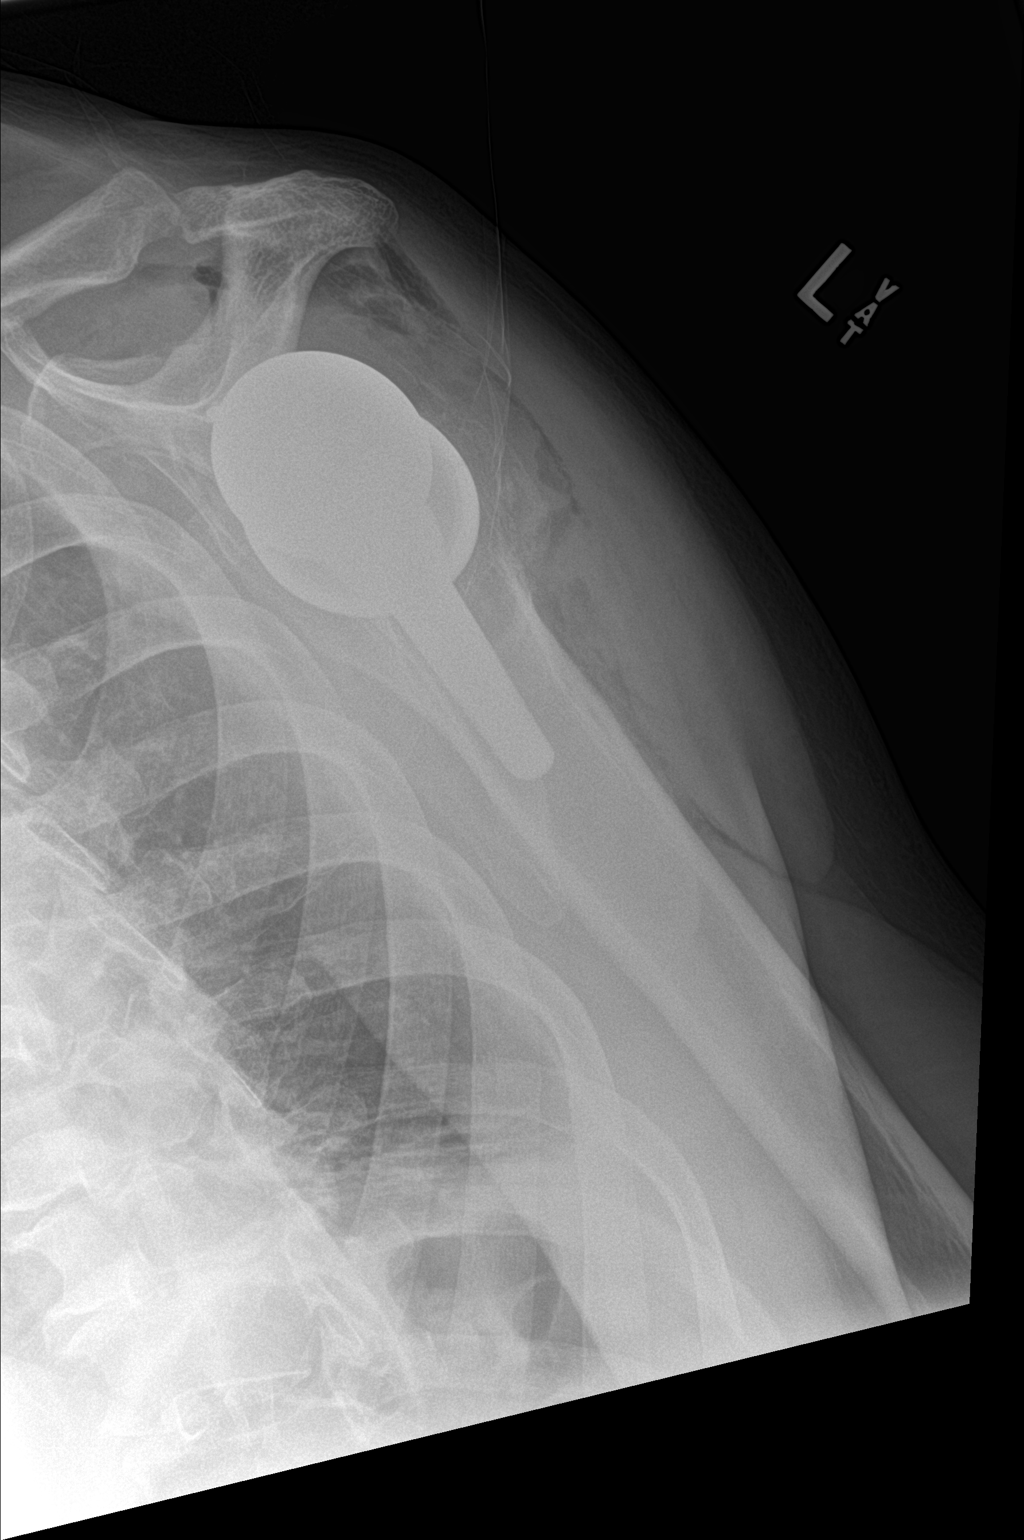

[2 of 2 positions shown; findings below may reference images not displayed]

FINDINGS: Well seated components of a reversed total shoulder arthroplasty. No
complicating features are demonstrated.
IMPRESSION: Total left shoulder arthroplasty components in good position without
complicating features.

## 2020-02-07 DIAGNOSIS — C44519 Basal cell carcinoma of skin of other part of trunk: Secondary | ICD-10-CM | POA: Diagnosis not present

## 2020-02-07 DIAGNOSIS — L821 Other seborrheic keratosis: Secondary | ICD-10-CM | POA: Diagnosis not present

## 2020-02-07 DIAGNOSIS — D1801 Hemangioma of skin and subcutaneous tissue: Secondary | ICD-10-CM | POA: Diagnosis not present

## 2020-02-07 DIAGNOSIS — Z85828 Personal history of other malignant neoplasm of skin: Secondary | ICD-10-CM | POA: Diagnosis not present

## 2020-02-07 DIAGNOSIS — L814 Other melanin hyperpigmentation: Secondary | ICD-10-CM | POA: Diagnosis not present

## 2020-02-07 DIAGNOSIS — C44619 Basal cell carcinoma of skin of left upper limb, including shoulder: Secondary | ICD-10-CM | POA: Diagnosis not present

## 2020-02-17 ENCOUNTER — Ambulatory Visit: Payer: BC Managed Care – PPO | Admitting: Infectious Disease

## 2020-02-29 DIAGNOSIS — M19012 Primary osteoarthritis, left shoulder: Secondary | ICD-10-CM | POA: Diagnosis not present

## 2020-03-13 DIAGNOSIS — K5792 Diverticulitis of intestine, part unspecified, without perforation or abscess without bleeding: Secondary | ICD-10-CM | POA: Diagnosis not present

## 2020-03-13 DIAGNOSIS — R509 Fever, unspecified: Secondary | ICD-10-CM | POA: Diagnosis not present

## 2020-03-14 ENCOUNTER — Other Ambulatory Visit: Payer: Self-pay

## 2020-03-14 ENCOUNTER — Inpatient Hospital Stay (HOSPITAL_BASED_OUTPATIENT_CLINIC_OR_DEPARTMENT_OTHER)
Admission: EM | Admit: 2020-03-14 | Discharge: 2020-03-17 | DRG: 392 | Disposition: A | Discharge status: Home / Self Care | Payer: BC Managed Care – PPO | Attending: Internal Medicine | Admitting: Internal Medicine

## 2020-03-14 ENCOUNTER — Emergency Department (HOSPITAL_BASED_OUTPATIENT_CLINIC_OR_DEPARTMENT_OTHER): Payer: BC Managed Care – PPO

## 2020-03-14 ENCOUNTER — Encounter (HOSPITAL_BASED_OUTPATIENT_CLINIC_OR_DEPARTMENT_OTHER): Payer: Self-pay | Admitting: *Deleted

## 2020-03-14 DIAGNOSIS — Z96612 Presence of left artificial shoulder joint: Secondary | ICD-10-CM | POA: Diagnosis present

## 2020-03-14 DIAGNOSIS — K5720 Diverticulitis of large intestine with perforation and abscess without bleeding: Principal | ICD-10-CM | POA: Diagnosis present

## 2020-03-14 DIAGNOSIS — R509 Fever, unspecified: Secondary | ICD-10-CM | POA: Diagnosis not present

## 2020-03-14 DIAGNOSIS — Z888 Allergy status to other drugs, medicaments and biological substances status: Secondary | ICD-10-CM

## 2020-03-14 DIAGNOSIS — K219 Gastro-esophageal reflux disease without esophagitis: Secondary | ICD-10-CM | POA: Diagnosis not present

## 2020-03-14 DIAGNOSIS — K5792 Diverticulitis of intestine, part unspecified, without perforation or abscess without bleeding: Secondary | ICD-10-CM | POA: Diagnosis not present

## 2020-03-14 DIAGNOSIS — K5900 Constipation, unspecified: Secondary | ICD-10-CM | POA: Diagnosis not present

## 2020-03-14 DIAGNOSIS — Z79890 Hormone replacement therapy (postmenopausal): Secondary | ICD-10-CM | POA: Diagnosis not present

## 2020-03-14 DIAGNOSIS — Z880 Allergy status to penicillin: Secondary | ICD-10-CM

## 2020-03-14 DIAGNOSIS — K6389 Other specified diseases of intestine: Secondary | ICD-10-CM | POA: Diagnosis not present

## 2020-03-14 DIAGNOSIS — N400 Enlarged prostate without lower urinary tract symptoms: Secondary | ICD-10-CM | POA: Diagnosis not present

## 2020-03-14 DIAGNOSIS — Z20822 Contact with and (suspected) exposure to covid-19: Secondary | ICD-10-CM | POA: Diagnosis not present

## 2020-03-14 DIAGNOSIS — Z79899 Other long term (current) drug therapy: Secondary | ICD-10-CM

## 2020-03-14 DIAGNOSIS — K5730 Diverticulosis of large intestine without perforation or abscess without bleeding: Secondary | ICD-10-CM | POA: Diagnosis not present

## 2020-03-14 DIAGNOSIS — M47816 Spondylosis without myelopathy or radiculopathy, lumbar region: Secondary | ICD-10-CM | POA: Diagnosis not present

## 2020-03-14 LAB — CBC WITH DIFFERENTIAL/PLATELET
Abs Immature Granulocytes: 0.06 10*3/uL (ref 0.00–0.07)
Basophils Absolute: 0 10*3/uL (ref 0.0–0.1)
Basophils Relative: 0 %
Eosinophils Absolute: 0 10*3/uL (ref 0.0–0.5)
Eosinophils Relative: 0 %
HCT: 47.8 % (ref 39.0–52.0)
Hemoglobin: 15.2 g/dL (ref 13.0–17.0)
Immature Granulocytes: 1 %
Lymphocytes Relative: 7 %
Lymphs Abs: 0.9 10*3/uL (ref 0.7–4.0)
MCH: 30.5 pg (ref 26.0–34.0)
MCHC: 31.8 g/dL (ref 30.0–36.0)
MCV: 95.8 fL (ref 80.0–100.0)
Monocytes Absolute: 0.4 10*3/uL (ref 0.1–1.0)
Monocytes Relative: 3 %
Neutro Abs: 10.3 10*3/uL — ABNORMAL HIGH (ref 1.7–7.7)
Neutrophils Relative %: 89 %
Platelets: 329 10*3/uL (ref 150–400)
RBC: 4.99 MIL/uL (ref 4.22–5.81)
RDW: 13.7 % (ref 11.5–15.5)
WBC: 11.6 10*3/uL — ABNORMAL HIGH (ref 4.0–10.5)
nRBC: 0 % (ref 0.0–0.2)

## 2020-03-14 LAB — COMPREHENSIVE METABOLIC PANEL
ALT: 50 U/L — ABNORMAL HIGH (ref 0–44)
AST: 28 U/L (ref 15–41)
Albumin: 4 g/dL (ref 3.5–5.0)
Alkaline Phosphatase: 60 U/L (ref 38–126)
Anion gap: 12 (ref 5–15)
BUN: 16 mg/dL (ref 8–23)
CO2: 25 mmol/L (ref 22–32)
Calcium: 8.7 mg/dL — ABNORMAL LOW (ref 8.9–10.3)
Chloride: 101 mmol/L (ref 98–111)
Creatinine, Ser: 1.04 mg/dL (ref 0.61–1.24)
GFR calc Af Amer: >60 mL/min (ref >60)
GFR calc non Af Amer: >60 mL/min (ref >60)
Glucose, Bld: 115 mg/dL — ABNORMAL HIGH (ref 70–99)
Potassium: 4 mmol/L (ref 3.5–5.1)
Sodium: 138 mmol/L (ref 135–145)
Total Bilirubin: 0.7 mg/dL (ref 0.3–1.2)
Total Protein: 7.7 g/dL (ref 6.5–8.1)

## 2020-03-14 LAB — LACTIC ACID, PLASMA: Lactic Acid, Venous: 1.8 mmol/L (ref 0.5–1.9)

## 2020-03-14 LAB — LIPASE, BLOOD: Lipase: 29 U/L (ref 11–51)

## 2020-03-14 LAB — SARS CORONAVIRUS 2 BY RT PCR (HOSPITAL ORDER, PERFORMED IN ~~LOC~~ HOSPITAL LAB): SARS Coronavirus 2: NEGATIVE

## 2020-03-14 MED ORDER — ACETAMINOPHEN 500 MG PO TABS
1000.0000 mg | ORAL_TABLET | Freq: Once | ORAL | Status: AC
  Administered 2020-03-14: 1000 mg via ORAL
  Filled 2020-03-14: qty 2

## 2020-03-14 MED ORDER — DEXTROSE-NACL 5-0.9 % IV SOLN
INTRAVENOUS | Status: AC
  Filled 2020-03-14: qty 1000

## 2020-03-14 MED ORDER — SODIUM CHLORIDE 0.9 % IV BOLUS
1000.0000 mL | Freq: Once | INTRAVENOUS | Status: AC
  Administered 2020-03-14: 1000 mL via INTRAVENOUS

## 2020-03-14 MED ORDER — ONDANSETRON HCL 4 MG PO TABS: 4.0000 mg | ORAL_TABLET | Freq: Four times a day (QID) | ORAL | Status: DC | PRN

## 2020-03-14 MED ORDER — CIPROFLOXACIN IN D5W 400 MG/200ML IV SOLN
400.0000 mg | Freq: Once | INTRAVENOUS | Status: AC
  Administered 2020-03-14: 400 mg via INTRAVENOUS
  Filled 2020-03-14: qty 200

## 2020-03-14 MED ORDER — ONDANSETRON HCL 4 MG/2ML IJ SOLN: 4.0000 mg | Freq: Four times a day (QID) | INTRAMUSCULAR | Status: DC | PRN

## 2020-03-14 MED ORDER — ONDANSETRON HCL 4 MG/2ML IJ SOLN
4.0000 mg | Freq: Once | INTRAMUSCULAR | Status: AC
  Administered 2020-03-14: 4 mg via INTRAVENOUS
  Filled 2020-03-14: qty 2

## 2020-03-14 MED ORDER — IOHEXOL 300 MG/ML  SOLN
100.0000 mL | Freq: Once | INTRAMUSCULAR | Status: AC | PRN
  Administered 2020-03-14: 100 mL via INTRAVENOUS

## 2020-03-14 MED ORDER — MORPHINE SULFATE (PF) 2 MG/ML IV SOLN: 1.0000 mg | INTRAVENOUS | Status: DC | PRN

## 2020-03-14 MED ORDER — MORPHINE SULFATE (PF) 4 MG/ML IV SOLN
4.0000 mg | Freq: Once | INTRAVENOUS | Status: AC
  Administered 2020-03-14: 4 mg via INTRAVENOUS
  Filled 2020-03-14: qty 1

## 2020-03-14 MED ORDER — SODIUM CHLORIDE 0.9 % IV SOLN
1.0000 g | Freq: Three times a day (TID) | INTRAVENOUS | Status: DC
  Administered 2020-03-14 – 2020-03-15 (×2): 1 g via INTRAVENOUS
  Filled 2020-03-14 (×5): qty 1

## 2020-03-14 MED ORDER — METRONIDAZOLE IN NACL 5-0.79 MG/ML-% IV SOLN: 500.0000 mg | Freq: Once | INTRAVENOUS | Status: DC

## 2020-03-14 NOTE — ED Provider Notes (Signed)
Patrick [name]   Patrick [name]: [id number] Arrival date & time: 03/14/20  1539     History Chief Complaint  Patient presents with  . Abdominal Pain    Patrick [name] is a 64 y.o. male.  Patrick [name] is a 64 y.o. male with a history of diverticulitis, GERD, BPH, arthritis, seasonal allergies, Covid infection, who presents to the emergency department for evaluation of left lower abdominal pain.  Patient states pain has been present since Friday it initially started as a very mild occasional pain but starting Sunday night pain became very severe and constant in the left lower quadrant.  He reports associated chills and low-grade fevers over the past 3 days.  No associated nausea or vomiting.  No diarrhea, constipation or blood in the stool.  He does have a history of prior diverticulitis that felt similar.  He states that Sunday night he came to the ED at the recommendation of his daughter who is a physician in [place], but did not stay for evaluation or get a CT scan done.  He went to urgent care yesterday and they felt this was most likely diverticulitis and started him on Cipro and Flagyl, but he reports persistently worsening pain and continued fevers today.        Past Medical History:  Diagnosis Date  . ACL sprain 11/2014   right  . Allergy   . Arthritis    shoulders,  . Basal cell carcinoma   . BPH (benign prostatic hyperplasia)    high PSA level  . Chronic lower back pain   . COVID-19 virus infection 08/19/2019  . Dental crowns present   . Diverticulosis   . GERD (gastroesophageal reflux disease)   . History of colon polyps   . History of umbilical hernia   . Plantar fasciitis   . Propionibacterium infection 02/04/2019  . 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 Seasonal allergies   . Tendonitis     Patient Active Problem List   Diagnosis Date Noted  . COVID-19 virus infection 08/19/2019  . 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 Propionibacterium infection 02/04/2019  . Degenerative arthritis of left shoulder region 01/27/2019  . S/P ACL reconstruction 12/08/2014    Past Surgical History:  Procedure Laterality Date  . ARTHROSCOPY WITH ANTERIOR CRUCIATE LIGAMENT (ACL) REPAIR WITH ANTERIOR TIBILIAS GRAFT Right 12/08/2014   Procedure: RIGHT KNEE ARTHROSCOPY WITH ANTERIOR CRUCIATE LIGAMENT (ACL) REPAIR;  Surgeon: Kathryne Hitch, MD;  Location: [facility];  Service: Orthopedics;  Laterality: Right;  . CHONDROPLASTY Right 12/08/2014   Procedure: CHONDROPLASTY PATELLO-FEMORAL JOINT;  Surgeon: Kathryne Hitch, MD;  Location: [facility];  Service: Orthopedics;  Laterality: Right;  . COLONOSCOPY  2020  . COLONOSCOPY WITH PROPOFOL  10/15/2012  . HERNIA REPAIR    . KNEE ARTHROSCOPY Right 09/23/2002  . KNEE ARTHROSCOPY W/ ACL RECONSTRUCTION Right 01/29/2007  . KNEE ARTHROSCOPY WITH DRILLING/MICROFRACTURE Right 12/08/2014   Procedure: DRILLING/MICROFRACTURE PATELLO-FEMORAL JOINT;  Surgeon: Kathryne Hitch, MD;  Location: [facility];  Service: Orthopedics;  Laterality: Right;  . KNEE ARTHROSCOPY WITH LATERAL MENISECTOMY Right 12/08/2014   Procedure: PARTIAL LATERAL MENISECTOMY;  Surgeon: Kathryne Hitch, MD;  Location: [place];  Service: Orthopedics;  Laterality: Right;  . LEG SURGERY Right 1994   [facility]  . LESION REMOVAL Right 01/28/2013   Procedure: BASAL CELL CARCINOMA REMOVAL RIGHT LOWER LIP AND RIGHT UPPER CHEEK WITH FROZEN SECTION X2;  Surgeon: Mary A Contogiannis,  MD;  Location: [facility];  Service: Plastics;  Laterality: Right;  . REVERSE SHOULDER ARTHROPLASTY Left 01/27/2019   Procedure: REVERSE SHOULDER ARTHROPLASTY;  Surgeon: Hiram Gash, MD;  Location: [facility];  Service: Orthopedics;  Laterality: Left;  . rotator cuff removal     left  . UMBILICAL HERNIA REPAIR         Family History  Problem Relation Age of Onset  . Colon cancer Neg  Hx   . Colon polyps Neg Hx   . Esophageal cancer Neg Hx   . Rectal cancer Neg Hx   . Stomach cancer Neg Hx     Social History   Tobacco Use  . Smoking status: Never Smoker  . Smokeless tobacco: Never Used  Vaping Use  . Vaping Use: Never used  Substance Use Topics  . Alcohol use: Yes    Comment: occ beer  . Drug use: No    Home Medications Prior to Admission medications   Medication Sig Start Date End Date Taking? Authorizing Provider  B-D 3CC LUER-LOK SYR 20GX1" 20G X 1" 3 ML MISC USE TO INJECT ONCE WEEKLY 05/20/19   [provider]  BD DISP NEEDLE 23G X 1" MISC INJECT ONCE A WEEK 05/31/19   [provider]  Cetirizine HCl (ZYRTEC PO) Take 10 mg by mouth daily as needed (allergies).     [provider]  DICLOFENAC SODIUM EX Apply topically. Apply topically per instructions    [provider]  doxycycline (VIBRA-TABS) 100 MG tablet Take 1 tablet (100 mg total) by mouth 2 (two) times daily. 08/19/19   Truman Hayward, MD  omeprazole (PRILOSEC) 20 MG capsule Take 20 mg by mouth daily.    [provider]  testosterone cypionate (DEPOTESTOSTERONE CYPIONATE) 200 MG/ML injection Inject 200 mg into the muscle every 14 (fourteen) days.  10/08/18   [provider]  UNABLE TO FIND Take 650 mg by mouth daily. Infla 650    [provider]    Allergies    Penicillins and Rifampin  Review of Systems   Review of Systems  Constitutional: Positive for chills and fever.  HENT: Negative.   Eyes: Negative for visual disturbance.  Respiratory: Negative for cough and shortness of breath.   Cardiovascular: Negative for chest pain.  Gastrointestinal: Positive for abdominal pain. Negative for abdominal distention, blood in stool, constipation, diarrhea, nausea and vomiting.  Genitourinary: Negative for dysuria and frequency.  Musculoskeletal: Negative for arthralgias and myalgias.  Skin: Negative for color change and rash.    Neurological: Negative for dizziness, syncope and light-headedness.    Physical Exam Updated Vital Signs BP (!) 137/97   Pulse (!) 112   Temp 100.3 F (37.9 C)   Resp 18   Ht 6\' 1"  (1.854 m)   Wt 86.2 kg   SpO2 100%   BMI 25.07 kg/m   Physical Exam Vitals and nursing [name] reviewed.  Constitutional:      General: He is not in acute distress.    Appearance: He is well-developed and normal weight. He is ill-appearing. He is not diaphoretic.     Comments: Patient is ill-appearing, but in no acute distress  HENT:     Head: Normocephalic and atraumatic.  Eyes:     General:        Right eye: No discharge.        Left eye: No discharge.     Pupils: Pupils are equal, round, and reactive to light.  Cardiovascular:     Rate and Rhythm: Normal rate and regular rhythm.     Heart sounds: Normal heart sounds.  Pulmonary:     Effort: Pulmonary effort is normal. No respiratory distress.     Breath sounds: Normal breath sounds. No wheezing or rales.  Abdominal:     General: Bowel sounds are normal. There is no distension.     Palpations: There is no mass.     Tenderness: There is abdominal tenderness in the left lower quadrant. There is guarding.     Comments: Abdomen is nondistended, bowel sounds are present throughout, there is some mild generalized tenderness throughout with focal severe tenderness in the left lower quadrant with guarding on exam, patient with discomfort even with just walking, appears peritoneal  Musculoskeletal:        General: No deformity.     Cervical back: Neck supple.  Skin:    General: Skin is warm and dry.     Capillary Refill: Capillary refill takes less than 2 seconds.  Neurological:     Mental Status: He is alert.     Coordination: Coordination normal.     Comments: Speech is clear, able to follow commands Moves extremities without ataxia, coordination intact  Psychiatric:        Mood and Affect: Mood normal.        Behavior: Behavior normal.      ED Results / Procedures / Treatments   Labs (all labs ordered are listed, but only abnormal results are displayed) Labs Reviewed  CBC WITH DIFFERENTIAL/PLATELET - Abnormal; Notable for the following components:      Result Value   WBC 11.6 (*)    Neutro Abs 10.3 (*)    All other components within normal limits  COMPREHENSIVE METABOLIC PANEL - Abnormal; Notable for the following components:   Glucose, Bld 115 (*)    Calcium 8.7 (*)    ALT 50 (*)    All other components within normal limits  CULTURE, BLOOD (ROUTINE X 2)  CULTURE, BLOOD (ROUTINE X 2)  SARS CORONAVIRUS 2 BY RT PCR (HOSPITAL ORDER, [facility])  LIPASE, BLOOD  LACTIC ACID, PLASMA  LACTIC ACID, PLASMA    EKG None  Radiology CT ABDOMEN W CONTRAST  Result Date: 03/14/2020 CLINICAL DATA:  Left lower quadrant abdominal pain for 3 days. EXAM: CT ABDOMEN WITH CONTRAST TECHNIQUE: Multidetector CT imaging of the abdomen was performed using the standard protocol following bolus administration of intravenous contrast. CONTRAST:  145mL OMNIPAQUE IOHEXOL 300 MG/ML  SOLN COMPARISON:  06/22/2018 FINDINGS: Lower chest: The lung bases are clear of acute process. No pleural effusion or pulmonary lesions. The heart is normal in size. No pericardial effusion. The distal esophagus and aorta are unremarkable. Hepatobiliary: No hepatic lesions or intrahepatic biliary dilatation. The gallbladder appears normal. No common bile duct dilatation. Pancreas: No mass, inflammation or ductal dilatation. Spleen: Normal size.  No focal lesions. Adrenals/Urinary Tract: The adrenal glands and kidneys are unremarkable. The bladder appears normal. Stomach/Bowel: The stomach, duodenum and small bowel are unremarkable. No acute inflammatory process, mass lesion or obstructive finding. The terminal ileum is normal. The appendix is normal. Diffuse colonic diverticulosis is noted. Focal area of complicated diverticulitis involving the  upper sigmoid colon. Evidence of perforation with small collection of free air anterior to the sigmoid colon. There is also scattered free air in the abdomen anteriorly. No abscess is identified. Vascular/Lymphatic: The aorta is normal in caliber. No atherosclerotic calcifications, aneurysm or  dissection. The branch vessels are patent. The major venous structures are patent. Retroaortic left renal vein is noted. Small scattered mesenteric and retroperitoneal lymph nodes, likely reactive. No mass or overt adenopathy. Other: No pelvic mass or adenopathy. No free pelvic fluid collections. No inguinal mass or adenopathy. No abdominal wall hernia or subcutaneous lesions. Prominent left inguinal ring containing fat, likely inguinal hernia. Musculoskeletal: Age advanced degenerative lumbar spondylosis with multilevel disc disease and facet disease. No acute bony findings. IMPRESSION: 1. CT findings consistent with acute complicated diverticulitis involving the upper sigmoid colon with perforation and free air. No abscess. 2. No other significant abdominal findings, mass lesion or adenopathy. 3. Age advanced degenerative lumbar spondylosis with multilevel disc disease and facet disease. Electronically Signed   By: Marijo Sanes M.D.   On: 03/14/2020 17:35    Procedures .Critical Care Performed by: Jacqlyn Larsen, PA-C Authorized by: Jacqlyn Larsen, PA-C   Critical care provider statement:    Critical care time (minutes):  45   Critical care was necessary to treat or prevent imminent or life-threatening deterioration of the following conditions:  Sepsis   Critical care was time spent personally by me on the following activities:  Discussions with consultants, evaluation of patient's response to treatment, examination of patient, ordering and performing treatments and interventions, ordering and review of laboratory studies, ordering and review of radiographic studies, pulse oximetry, re-evaluation of patient's  condition, obtaining history from patient or surrogate and review of old charts   (including critical care time)  Medications Ordered in ED Medications  ciprofloxacin (CIPRO) IVPB 400 mg (has no administration in time range)    And  metroNIDAZOLE (FLAGYL) IVPB 500 mg (has no administration in time range)  sodium chloride 0.9 % bolus 1,000 mL (has no administration in time range)  ondansetron (ZOFRAN) injection 4 mg (has no administration in time range)  morphine 4 MG/ML injection 4 mg (has no administration in time range)  iohexol (OMNIPAQUE) 300 MG/ML solution 100 mL (100 mLs Intravenous Contrast Given 03/14/20 1722)    ED Course  I have reviewed the triage vital signs and the nursing notes.  Pertinent labs & imaging results that were available during my care of the patient were reviewed by me and considered in my medical decision making (see chart for details).    MDM Rules/Calculators/A&P                          64 year old male presents with severe left lower quadrant abdominal pain associated with fevers and chills, history of previous diverticulitis, was started on antibiotics for the same in urgent care yesterday but directed here due to continued fever.  On arrival he is ill-appearing, febrile and tachycardic, but normotensive.  He appears very uncomfortable, is guarding and unable to walk without discomfort.  He has significant tenderness and guarding on exam and exhibits peritoneal signs.  Lab evaluation including lactic acid, blood cultures and abdominal labs as well as CT abdomen pelvis initiated from triage.  I have independently ordered, reviewed and interpreted all labs and imaging: CBC: Mild leukocytosis of 11.6, normal hemoglobin CMP: Glucose of 115, calcium of 8.7, normal renal and liver function Lipase: WNL Lactic acid: 1.8, not elevated, does not suggest severe sepsis Blood cultures: Pending  CT shows acute complicated diverticulitis of the upper sigmoid colon with  perforation and free air present, no abscess or free fluid noted.  No other significant findings.  Will consult general surgery  regarding CT findings.  Case discussed with Dr. Rosendo Gros with general surgery who reviewed patient's CT scan, sees only a small focus of free air and no free fluid, would not immediately plan to take patient to surgery and recommend starting with antibiotics and monitoring closely.  Request medicine admission, but would like to know about bed status, if patient is not intended to get a bed may need ED to ED transfer for more prompt surgical evaluation.  Also received call from pharmacy who recommends meropenem rather than Cipro, Flagyl and gentamicin for antibiotic coverage, meropenem ordered for patient in the ER, he did receive 1 dose of Cipro prior to starting this med.  Case discussed with Dr. Dione Plover with [facility] hospitalist who will see and admit patient, discussed bed status with [facility] and they do not have beds available at either [facility] or [facility] and are not sure exactly when beds will be available.  I discussed bed status with Dr. Rosendo Gros given patient is febrile, we both agree that patient would be best served at [facility] emergency department where if he clinically worsens surgical team is available.  Will transfer ED to the ED for prompt surgical evaluation.  Case discussed with Dr. Roslynn Amble at [facility] ED who accepts patient for ED to ED transfer.  Final Clinical Impression(s) / ED Diagnoses Final diagnoses:  Perforation of sigmoid colon due to diverticulitis    Rx / DC Orders ED Discharge Orders    None       Jacqlyn Larsen, PA-C 03/14/20 1901    Lennice Sites, DO 03/14/20 2133

## 2020-03-14 NOTE — Progress Notes (Signed)
Pharmacy Antibiotic Note  Patrick [name] is a 64 y.o. male admitted on 03/14/2020 with diverticulitis with perf.  Pharmacy has been consulted for Merrem dosing.  Plan: Meropenem 1g IV every 8 hours Monitor renal function, clinical progression and surg recs, LOT  Height: 6\' 1"  (185.4 cm) Weight: 86.2 kg (190 lb) IBW/kg (Calculated) : 79.9  Temp (24hrs), Avg:100.3 F (37.9 C), Min:100.3 F (37.9 C), Max:100.3 F (37.9 C)  Recent Labs  Lab 03/14/20 1602  WBC 11.6*  CREATININE 1.04  LATICACIDVEN 1.8    Estimated Creatinine Clearance: 84.3 mL/min (by C-G formula based on SCr of 1.04 mg/dL).    Allergies  Allergen Reactions  . Penicillins Anaphylaxis    Did it involve swelling of the face/tongue/throat, SOB, or low BP? Yes Did it involve sudden or severe rash/hives, skin peeling, or any reaction on the inside of your mouth or nose? No Did you need to seek medical attention at a hospital or doctor's office? Yes When did it last happen?Childhood allergy If all above answers are "NO", may proceed with cephalosporin use.   . Rifampin Other (See Comments)    He had severe headaches gastroesophageal reflux disease and malaise    Bertis Ruddy, PharmD Clinical Pharmacist ED Pharmacist Phone # [phone] 03/14/2020 6:23 PM

## 2020-03-14 NOTE — ED Triage Notes (Signed)
Pt c/o left lower abd pain x 3 days , HX diverticulitis

## 2020-03-14 NOTE — H&P (Signed)
History and Physical    Patrick [name] ELF:[id number] DOB: [birth year] DOA: 03/14/2020  PCP: Shon Baton, MD  Patient coming from: Home.  Chief Complaint: Abdominal pain.  HPI: Patrick [name] is a 64 y.o. male with no significant past medical history presents to the ER with complaint of worsening abdominal pain.  Patient has been having abdominal pain for the last 3 days mostly in the lower quadrant constant with no associated food.  Denies any diarrhea or nausea or vomiting.  Subjective feeling of fever chills.  Patient had gone to urgent care center 2 days ago and was prescribed antibiotics despite taking which patient's pain worsened.  ED Course: In the ER patient was febrile with temperature 103 F and CT scan of the abdomen pelvis shows features consistent with sigmoid diverticulitis with microperforation.  On-call general surgery was consulted patient started on meropenem.  Admitted for further management.  Labs are significant for WBC count of 11.6 Covid test negative.  Review of Systems: As per HPI, rest all negative.   Past Medical History:  Diagnosis Date  . ACL sprain 11/2014   right  . Allergy   . Arthritis    shoulders,  . Basal cell carcinoma   . BPH (benign prostatic hyperplasia)    high PSA level  . Chronic lower back pain   . COVID-19 virus infection 08/19/2019  . Dental crowns present   . Diverticulosis   . GERD (gastroesophageal reflux disease)   . History of colon polyps   . History of umbilical hernia   . Plantar fasciitis   . Propionibacterium infection 02/04/2019  . 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 Seasonal allergies   . Tendonitis     Past Surgical History:  Procedure Laterality Date  . ARTHROSCOPY WITH ANTERIOR CRUCIATE LIGAMENT (ACL) REPAIR WITH ANTERIOR TIBILIAS GRAFT Right 12/08/2014   Procedure: RIGHT KNEE ARTHROSCOPY WITH ANTERIOR CRUCIATE LIGAMENT (ACL) REPAIR;  Surgeon: Kathryne Hitch, MD;  Location: [place];  Service:  Orthopedics;  Laterality: Right;  . CHONDROPLASTY Right 12/08/2014   Procedure: CHONDROPLASTY PATELLO-FEMORAL JOINT;  Surgeon: Kathryne Hitch, MD;  Location: [facility];  Service: Orthopedics;  Laterality: Right;  . COLONOSCOPY  2020  . COLONOSCOPY WITH PROPOFOL  10/15/2012  . HERNIA REPAIR    . KNEE ARTHROSCOPY Right 09/23/2002  . KNEE ARTHROSCOPY W/ ACL RECONSTRUCTION Right 01/29/2007  . KNEE ARTHROSCOPY WITH DRILLING/MICROFRACTURE Right 12/08/2014   Procedure: DRILLING/MICROFRACTURE PATELLO-FEMORAL JOINT;  Surgeon: Kathryne Hitch, MD;  Location: [facility];  Service: Orthopedics;  Laterality: Right;  . KNEE ARTHROSCOPY WITH LATERAL MENISECTOMY Right 12/08/2014   Procedure: PARTIAL LATERAL MENISECTOMY;  Surgeon: Kathryne Hitch, MD;  Location: [facility];  Service: Orthopedics;  Laterality: Right;  . LEG SURGERY Right 1994   [facility]  . LESION REMOVAL Right 01/28/2013   Procedure: BASAL CELL CARCINOMA REMOVAL RIGHT LOWER LIP AND RIGHT UPPER CHEEK WITH FROZEN SECTION X2;  Surgeon: Charlene Brooke, MD;  Location: [facility];  Service: Plastics;  Laterality: Right;  . REVERSE SHOULDER ARTHROPLASTY Left 01/27/2019   Procedure: REVERSE SHOULDER ARTHROPLASTY;  Surgeon: Hiram Gash, MD;  Location: [facility];  Service: Orthopedics;  Laterality: Left;  . rotator cuff removal     left  . UMBILICAL HERNIA REPAIR       reports that he has never smoked. He has never used smokeless tobacco. He reports current alcohol use. He reports that he does not use drugs.  Allergies  Allergen Reactions  . Penicillins Anaphylaxis    Did it involve swelling of the face/tongue/throat, SOB, or low BP? Yes Did it involve sudden or severe rash/hives, skin peeling, or any reaction on the inside of your mouth or nose? No Did you need to seek medical attention at a hospital or doctor's office? Yes When did it last happen?Childhood allergy If all above answers are  "NO", may proceed with cephalosporin use.   . Rifampin Other (See Comments)    He had severe headaches gastroesophageal reflux disease and malaise    Family History  Problem Relation Age of Onset  . Colon cancer Neg Hx   . Colon polyps Neg Hx   . Esophageal cancer Neg Hx   . Rectal cancer Neg Hx   . Stomach cancer Neg Hx     Prior to Admission medications   Medication Sig Start Date End Date Taking? Authorizing Provider  B-D 3CC LUER-LOK SYR 20GX1" 20G X 1" 3 ML MISC USE TO INJECT ONCE WEEKLY 05/20/19  Yes [provider]  BD DISP NEEDLE 23G X 1" MISC INJECT ONCE A WEEK 05/31/19  Yes [provider]  Cetirizine HCl (ZYRTEC PO) Take 10 mg by mouth at bedtime.    Yes [provider]  omeprazole (PRILOSEC) 20 MG capsule Take 20 mg by mouth daily.   Yes [provider]  oxymetazoline (AFRIN) 0.05 % nasal spray Place 1 spray into both nostrils 2 (two) times daily.   Yes [provider]  testosterone cypionate (DEPOTESTOSTERONE CYPIONATE) 200 MG/ML injection Inject 200 mg into the muscle every 14 (fourteen) days.  10/08/18  Yes [provider]  doxycycline (VIBRA-TABS) 100 MG tablet Take 1 tablet (100 mg total) by mouth 2 (two) times daily. Patient not taking: Reported on 03/14/2020 08/19/19   Tommy Medal, Lavell Islam, MD    Physical Exam: Constitutional: Moderately built and nourished. Vitals:   03/14/20 1601 03/14/20 1817 03/14/20 1927 03/14/20 2150  BP:  121/74 118/76 137/77  Pulse: (!) 112 (!) 104 (!) 102 90  Resp:  20 20   Temp:  (!) 103 F (39.4 C) (!) 102 F (38.9 C) (!) 100.4 F (38 C)  TempSrc:  Oral Oral   SpO2:  97% 99% 97%  Weight:      Height:       Eyes: Anicteric no pallor. ENMT: No discharge from the ears eyes nose or mouth. Neck: No mass felt.  No neck rigidity. Respiratory: No rhonchi or crepitations. Cardiovascular: S1-S2 heard. Abdomen: Mild tenderness in the lower quadrant but no guarding or  rigidity. Musculoskeletal: No edema. Skin: No rash. Neurologic: Alert awake oriented to time place and person.  Moves all extremities. Psychiatric: Appears normal.  Normal affect.   Labs on Admission: I have personally reviewed following labs and imaging studies  CBC: Recent Labs  Lab 03/14/20 1602  WBC 11.6*  NEUTROABS 10.3*  HGB 15.2  HCT 47.8  MCV 95.8  PLT 262   Basic Metabolic Panel: Recent Labs  Lab 03/14/20 1602  NA 138  K 4.0  CL 101  CO2 25  GLUCOSE 115*  BUN 16  CREATININE 1.04  CALCIUM 8.7*   GFR: Estimated Creatinine Clearance: 84.3 mL/min (by C-G formula based on SCr of 1.04 mg/dL). Liver Function Tests: Recent Labs  Lab 03/14/20 1602  AST 28  ALT 50*  ALKPHOS 60  BILITOT 0.7  PROT 7.7  ALBUMIN 4.0   Recent Labs  Lab 03/14/20 1602  LIPASE 29  No results for input(s): AMMONIA in the last 168 hours. Coagulation Profile: No results for input(s): INR, PROTIME in the last 168 hours. Cardiac Enzymes: No results for input(s): CKTOTAL, CKMB, CKMBINDEX, TROPONINI in the last 168 hours. BNP (last 3 results) No results for input(s): PROBNP in the last 8760 hours. HbA1C: No results for input(s): HGBA1C in the last 72 hours. CBG: No results for input(s): GLUCAP in the last 168 hours. Lipid Profile: No results for input(s): CHOL, HDL, LDLCALC, TRIG, CHOLHDL, LDLDIRECT in the last 72 hours. Thyroid Function Tests: No results for input(s): TSH, T4TOTAL, FREET4, T3FREE, THYROIDAB in the last 72 hours. Anemia Panel: No results for input(s): VITAMINB12, FOLATE, FERRITIN, TIBC, IRON, RETICCTPCT in the last 72 hours. Urine analysis:    Component Value Date/Time   COLORURINE YELLOW 01/06/2012 [place] 01/06/2012 2339   LABSPEC 1.015 01/06/2012 2339   PHURINE 6.5 01/06/2012 2339   GLUCOSEU NEGATIVE 01/06/2012 2339   HGBUR NEGATIVE 01/06/2012 2339   BILIRUBINUR NEGATIVE 01/06/2012 2339   KETONESUR NEGATIVE 01/06/2012 2339    PROTEINUR NEGATIVE 01/06/2012 2339   UROBILINOGEN 0.2 01/06/2012 2339   NITRITE NEGATIVE 01/06/2012 2339   LEUKOCYTESUR NEGATIVE 01/06/2012 2339   Sepsis Labs: @LABRCNTIP (procalcitonin:4,lacticidven:4) ) Recent Results (from the past 240 hour(s))  SARS Coronavirus 2 by RT PCR (hospital order, performed in [facility] hospital lab) Nasopharyngeal Nasopharyngeal Swab     Status: None   Collection Time: 03/14/20  5:44 PM   Specimen: Nasopharyngeal Swab  Result Value Ref Range Status   SARS Coronavirus 2 NEGATIVE NEGATIVE Final    Comment: (NOTE) SARS-CoV-2 target nucleic acids are NOT DETECTED.  The SARS-CoV-2 RNA is generally detectable in upper and lower respiratory specimens during the acute phase of infection. The lowest concentration of SARS-CoV-2 viral copies this assay can detect is 250 copies / mL. A negative result does not preclude SARS-CoV-2 infection and should not be used as the sole basis for treatment or other patient management decisions.  A negative result may occur with improper specimen collection / handling, submission of specimen other than nasopharyngeal swab, presence of viral mutation(s) within the areas targeted by this assay, and inadequate number of viral copies (<250 copies / mL). A negative result must be combined with clinical observations, patient history, and epidemiological information.  Fact Sheet for Patients:   StrictlyIdeas.no  Fact Sheet for Healthcare Providers: BankingDealers.co.za  This test is not yet approved or  cleared by the [place] FDA and has been authorized for detection and/or diagnosis of SARS-CoV-2 by FDA under an Emergency Use Authorization (EUA).  This EUA will remain in effect (meaning this test can be used) for the duration of the COVID-19 declaration under Section 564(b)(1) of the Act, 21 U.S.C. section 360bbb-3(b)(1), unless the authorization is terminated or revoked  sooner.  Performed at [facility], [address] [place], [place] [address]      Radiological Exams on Admission: CT ABDOMEN W CONTRAST  Result Date: 03/14/2020 CLINICAL DATA:  Left lower quadrant abdominal pain for 3 days. EXAM: CT ABDOMEN WITH CONTRAST TECHNIQUE: Multidetector CT imaging of the abdomen was performed using the standard protocol following bolus administration of intravenous contrast. CONTRAST:  164mL OMNIPAQUE IOHEXOL 300 MG/ML  SOLN COMPARISON:  06/22/2018 FINDINGS: Lower chest: The lung bases are clear of acute process. No pleural effusion or pulmonary lesions. The heart is normal in size. No pericardial effusion. The distal esophagus and aorta are unremarkable. Hepatobiliary: No hepatic lesions or intrahepatic  biliary dilatation. The gallbladder appears normal. No common bile duct dilatation. Pancreas: No mass, inflammation or ductal dilatation. Spleen: Normal size.  No focal lesions. Adrenals/Urinary Tract: The adrenal glands and kidneys are unremarkable. The bladder appears normal. Stomach/Bowel: The stomach, duodenum and small bowel are unremarkable. No acute inflammatory process, mass lesion or obstructive finding. The terminal ileum is normal. The appendix is normal. Diffuse colonic diverticulosis is noted. Focal area of complicated diverticulitis involving the upper sigmoid colon. Evidence of perforation with small collection of free air anterior to the sigmoid colon. There is also scattered free air in the abdomen anteriorly. No abscess is identified. Vascular/Lymphatic: The aorta is normal in caliber. No atherosclerotic calcifications, aneurysm or dissection. The branch vessels are patent. The major venous structures are patent. Retroaortic left renal vein is noted. Small scattered mesenteric and retroperitoneal lymph nodes, likely reactive. No mass or overt adenopathy. Other: No pelvic mass or adenopathy. No free pelvic fluid collections. No inguinal mass or  adenopathy. No abdominal wall hernia or subcutaneous lesions. Prominent left inguinal ring containing fat, likely inguinal hernia. Musculoskeletal: Age advanced degenerative lumbar spondylosis with multilevel disc disease and facet disease. No acute bony findings. IMPRESSION: 1. CT findings consistent with acute complicated diverticulitis involving the upper sigmoid colon with perforation and free air. No abscess. 2. No other significant abdominal findings, mass lesion or adenopathy. 3. Age advanced degenerative lumbar spondylosis with multilevel disc disease and facet disease. Electronically Signed   By: Marijo Sanes M.D.   On: 03/14/2020 17:35     Assessment/Plan Principal Problem:   Diverticulitis of colon with perforation Active Problems:   Diverticulitis    1. Acute sigmoid diverticulitis with microperforation for which general surgery has been consulted.  Appreciate general surgery consult and recommendations.  Patient on empiric antibiotics IV fluids pain medications and n.p.o.  Since patient has sigmoid diverticulitis with perforation will need inpatient status.   DVT prophylaxis: SCDs for now avoiding anticoagulation in anticipation of possible procedure. Code Status: Full code. Family Communication: Discussed with patient. Disposition Plan: Home. Consults called: General surgery. Admission status: Inpatient.   Rise Patience MD [facility] Pager [phone].  If 7PM-7AM, please contact night-coverage [email or web address] Password [facility]  03/14/2020, 11:46 PM
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
## 2020-03-14 NOTE — ED Notes (Addendum)
Pt arrived [facility] from [facility] Bowel needs surgery

## 2020-03-14 NOTE — Progress Notes (Signed)
Spoke w Benedetto Goad PA at [facility] regarding transfer of patient.  Seen in ED for abdominal pain, on CT scan found to have acute complicated diverticulitis and discussed with surgery, current plan is to treat medically with antibiotics rather than surgically and monitor. Per our discussion patient stable for floor at present time. Currently on Cipro/Flagyl but will be switched to Meropenem, I also requested T&S and coags for possible surgery if condition deteriorates.   Per Surgery would prefer they go to [facility], per [facility] no beds at either [facility] or [facility] at present. Plan to send ED to ED to [facility] and will place bed request for [facility].

## 2020-03-15 DIAGNOSIS — K219 Gastro-esophageal reflux disease without esophagitis: Secondary | ICD-10-CM | POA: Diagnosis present

## 2020-03-15 LAB — CBC
HCT: 42.1 % (ref 39.0–52.0)
Hemoglobin: 13.8 g/dL (ref 13.0–17.0)
MCH: 31.7 pg (ref 26.0–34.0)
MCHC: 32.8 g/dL (ref 30.0–36.0)
MCV: 96.8 fL (ref 80.0–100.0)
Platelets: 266 10*3/uL (ref 150–400)
RBC: 4.35 MIL/uL (ref 4.22–5.81)
RDW: 13.7 % (ref 11.5–15.5)
WBC: 9.7 10*3/uL (ref 4.0–10.5)
nRBC: 0 % (ref 0.0–0.2)

## 2020-03-15 LAB — BASIC METABOLIC PANEL
Anion gap: 10 (ref 5–15)
BUN: 16 mg/dL (ref 8–23)
CO2: 24 mmol/L (ref 22–32)
Calcium: 8.6 mg/dL — ABNORMAL LOW (ref 8.9–10.3)
Chloride: 105 mmol/L (ref 98–111)
Creatinine, Ser: 1.01 mg/dL (ref 0.61–1.24)
GFR calc Af Amer: >60 mL/min (ref >60)
GFR calc non Af Amer: >60 mL/min (ref >60)
Glucose, Bld: 99 mg/dL (ref 70–99)
Potassium: 4.2 mmol/L (ref 3.5–5.1)
Sodium: 139 mmol/L (ref 135–145)

## 2020-03-15 LAB — HIV ANTIBODY (ROUTINE TESTING W REFLEX): HIV Screen 4th Generation wRfx: NONREACTIVE

## 2020-03-15 MED ORDER — CETIRIZINE HCL 10 MG PO TABS
10.0000 mg | ORAL_TABLET | Freq: Every day | ORAL | Status: DC
  Filled 2020-03-15 (×2): qty 1

## 2020-03-15 MED ORDER — PANTOPRAZOLE SODIUM 40 MG PO TBEC
40.0000 mg | DELAYED_RELEASE_TABLET | Freq: Every day | ORAL | Status: DC
  Administered 2020-03-16 – 2020-03-17 (×3): 40 mg via ORAL
  Filled 2020-03-15 (×3): qty 1

## 2020-03-15 MED ORDER — ACETAMINOPHEN 325 MG PO TABS
650.0000 mg | ORAL_TABLET | Freq: Four times a day (QID) | ORAL | Status: DC | PRN
  Administered 2020-03-15 – 2020-03-17 (×3): 650 mg via ORAL
  Filled 2020-03-15 (×3): qty 2

## 2020-03-15 MED ORDER — OXYMETAZOLINE HCL 0.05 % NA SOLN
1.0000 | Freq: Two times a day (BID) | NASAL | Status: DC
  Administered 2020-03-16 – 2020-03-17 (×3): 1 via NASAL
  Filled 2020-03-15: qty 15

## 2020-03-15 MED ORDER — SODIUM CHLORIDE 0.9 % IV SOLN
1.0000 g | Freq: Three times a day (TID) | INTRAVENOUS | Status: DC
  Administered 2020-03-15 – 2020-03-17 (×6): 1 g via INTRAVENOUS
  Filled 2020-03-15 (×6): qty 1

## 2020-03-15 NOTE — Progress Notes (Signed)
PROGRESS NOTE    Patrick [name]  OZD:[id number] DOB: [birth year] DOA: 03/14/2020 PCP: Shon Baton, MD    Chief Complaint  Patient presents with  . Abdominal Pain    Brief Narrative: HPI per Dr. Kallie Edward is a 64 y.o. male with no significant past medical history presents to the ER with complaint of worsening abdominal pain.  Patient has been having abdominal pain for the last 3 days mostly in the lower quadrant constant with no associated food.  Denies any diarrhea or nausea or vomiting.  Subjective feeling of fever chills.  Patient had gone to urgent care center 2 days ago and was prescribed antibiotics despite taking which patient's pain worsened.  ED Course: In the ER patient was febrile with temperature 103 F and CT scan of the abdomen pelvis shows features consistent with sigmoid diverticulitis with microperforation.  On-call general surgery was consulted patient started on meropenem.  Admitted for further management.  Labs are significant for WBC count of 11.6 Covid test negative.   Assessment & Plan:   Principal Problem:   Diverticulitis of colon with perforation Active Problems:   Diverticulitis   Gastroesophageal reflux disease  1 complicated acute diverticulitis with microperforation Patient presented to the ED with 3-day history of worsening lower quadrant abdominal pain, subjective fever and chills, noted to have a temperature of 103 F in the ED CT abdomen with features consistent with sigmoid diverticulitis with microperforation.  Patient seen in consultation by general surgery who recommended bowel rest.  Patient currently on IV Merrem and IV Flagyl.  Patient improving clinically.  Currently afebrile.  Blood cultures with no growth to date.  Leukocytosis trending down.  Patient being followed by general surgery.  Diet has been advanced to a clear liquid diet.  Continue IV antibiotics.  IV fluids.  Pain management.  Supportive care.  Appreciate general surgery  input and recommendations.  2.  Gastroesophageal reflux disease PPI.   DVT prophylaxis: SCDs Code Status: Full Family Communication: Updated patient.  No family at bedside. Disposition:   Status is: Inpatient    Dispo: The patient is from: Home              Anticipated d/c is to: Home              Anticipated d/c date is: 2 to 3 days.              Patient currently receiving IV antibiotics: Acute diverticulitis with perforation.  Not stable for discharge.       Consultants:   General surgery: Dr. Rosendo Gros 03/14/2020  Procedures:   CT abdomen  03/14/2020  Antimicrobials:   IV Flagyl 03/14/2020>>>>  IV Merrem 03/14/2020>>>>   Subjective: Patient laying on gurney in the ED.  Denies any chest pain or shortness of breath.  No nausea or emesis.  Feels abdominal pain is improving since admission.  No bowel movement.  Positive flatus.  Objective: Vitals:   03/15/20 1429 03/15/20 1640 03/15/20 1707 03/15/20 2045  BP: 122/74 117/73 127/76 113/72  Pulse: 77 80 81 86  Resp: 20 20  18   Temp:   98.7 F (37.1 C) 99.9 F (37.7 C)  TempSrc:   Oral Oral  SpO2: 100% 94% 100% 97%  Weight:      Height:        Intake/Output Summary (Last 24 hours) at 03/15/2020 2051 Last data filed at 03/15/2020 1715 Gross per 24 hour  Intake 1100 ml  Output 0 ml  Net 1100 ml   Filed Weights   03/14/20 1543  Weight: 86.2 kg    Examination:  General exam: Appears calm and comfortable  Respiratory system: Clear to auscultation. Respiratory effort normal. Cardiovascular system: S1 & S2 heard, RRR. No JVD, murmurs, rubs, gallops or clicks. No pedal edema. Gastrointestinal system: Abdomen is nondistended, soft and tender to palpation in the lower abdominal/suprapubic region.  Positive bowel sounds.  No rebound.  No guarding.  Central nervous system: Alert and oriented. No focal neurological deficits. Extremities: Symmetric 5 x 5 power. Skin: No rashes, lesions or ulcers Psychiatry: Judgement and  insight appear normal. Mood & affect appropriate.     Data Reviewed: I have personally reviewed following labs and imaging studies  CBC: Recent Labs  Lab 03/14/20 1602 03/15/20 0601  WBC 11.6* 9.7  NEUTROABS 10.3*  --   HGB 15.2 13.8  HCT 47.8 42.1  MCV 95.8 96.8  PLT 329 696    Basic Metabolic Panel: Recent Labs  Lab 03/14/20 1602 03/15/20 0601  NA 138 139  K 4.0 4.2  CL 101 105  CO2 25 24  GLUCOSE 115* 99  BUN 16 16  CREATININE 1.04 1.01  CALCIUM 8.7* 8.6*    GFR: Estimated Creatinine Clearance: 86.8 mL/min (by C-G formula based on SCr of 1.01 mg/dL).  Liver Function Tests: Recent Labs  Lab 03/14/20 1602  AST 28  ALT 50*  ALKPHOS 60  BILITOT 0.7  PROT 7.7  ALBUMIN 4.0    CBG: No results for input(s): GLUCAP in the last 168 hours.   Recent Results (from the past 240 hour(s))  Blood culture (routine x 2)     Status: None (Preliminary result)   Collection Time: 03/14/20  3:55 PM   Specimen: BLOOD  Result Value Ref Range Status   Specimen Description   Final    BLOOD BLOOD RIGHT ARM Performed at [facility], [address] [place], [place] [address]    Special Requests   Final    BOTTLES DRAWN AEROBIC ONLY Blood Culture results may not be optimal due to an inadequate volume of blood received in culture bottles Performed at [facility], [address] [place], [place] [address]    Culture   Final    NO GROWTH < 12 HOURS Performed at [facility], [place] [address], [place], [place] [address]    Report Status PENDING  Incomplete  Blood culture (routine x 2)     Status: None (Preliminary result)   Collection Time: 03/14/20  4:05 PM   Specimen: BLOOD  Result Value Ref Range Status   Specimen Description   Final    BLOOD RIGHT ANTECUBITAL Performed at [facility], [address] [place], [place] [address]    Special Requests   Final    BOTTLES DRAWN AEROBIC AND ANAEROBIC Blood Culture adequate  volume Performed at [facility], [address] [place], [place] [address]    Culture   Final    NO GROWTH < 12 HOURS Performed at [facility], [place] [address], [place], [place] [address]    Report Status PENDING  Incomplete  SARS Coronavirus 2 by RT PCR (hospital order, performed in [facility] hospital lab) Nasopharyngeal Nasopharyngeal Swab     Status: None   Collection Time: 03/14/20  5:44 PM   Specimen: Nasopharyngeal Swab  Result Value Ref Range Status   SARS Coronavirus 2 NEGATIVE NEGATIVE  Final    Comment: (NOTE) SARS-CoV-2 target nucleic acids are NOT DETECTED.  The SARS-CoV-2 RNA is generally detectable in upper and lower respiratory specimens during the acute phase of infection. The lowest concentration of SARS-CoV-2 viral copies this assay can detect is 250 copies / mL. A negative result does not preclude SARS-CoV-2 infection and should not be used as the sole basis for treatment or other patient management decisions.  A negative result may occur with improper specimen collection / handling, submission of specimen other than nasopharyngeal swab, presence of viral mutation(s) within the areas targeted by this assay, and inadequate number of viral copies (<250 copies / mL). A negative result must be combined with clinical observations, patient history, and epidemiological information.  Fact Sheet for Patients:   StrictlyIdeas.no  Fact Sheet for Healthcare Providers: BankingDealers.co.za  This test is not yet approved or  cleared by the [place] FDA and has been authorized for detection and/or diagnosis of SARS-CoV-2 by FDA under an Emergency Use Authorization (EUA).  This EUA will remain in effect (meaning this test can be used) for the duration of the COVID-19 declaration under Section 564(b)(1) of the Act, 21 U.S.C. section 360bbb-3(b)(1), unless the authorization is terminated or revoked  sooner.  Performed at [facility], [address] [place], [place] [address]          Radiology Studies: CT ABDOMEN W CONTRAST  Result Date: 03/14/2020 CLINICAL DATA:  Left lower quadrant abdominal pain for 3 days. EXAM: CT ABDOMEN WITH CONTRAST TECHNIQUE: Multidetector CT imaging of the abdomen was performed using the standard protocol following bolus administration of intravenous contrast. CONTRAST:  114mL OMNIPAQUE IOHEXOL 300 MG/ML  SOLN COMPARISON:  06/22/2018 FINDINGS: Lower chest: The lung bases are clear of acute process. No pleural effusion or pulmonary lesions. The heart is normal in size. No pericardial effusion. The distal esophagus and aorta are unremarkable. Hepatobiliary: No hepatic lesions or intrahepatic biliary dilatation. The gallbladder appears normal. No common bile duct dilatation. Pancreas: No mass, inflammation or ductal dilatation. Spleen: Normal size.  No focal lesions. Adrenals/Urinary Tract: The adrenal glands and kidneys are unremarkable. The bladder appears normal. Stomach/Bowel: The stomach, duodenum and small bowel are unremarkable. No acute inflammatory process, mass lesion or obstructive finding. The terminal ileum is normal. The appendix is normal. Diffuse colonic diverticulosis is noted. Focal area of complicated diverticulitis involving the upper sigmoid colon. Evidence of perforation with small collection of free air anterior to the sigmoid colon. There is also scattered free air in the abdomen anteriorly. No abscess is identified. Vascular/Lymphatic: The aorta is normal in caliber. No atherosclerotic calcifications, aneurysm or dissection. The branch vessels are patent. The major venous structures are patent. Retroaortic left renal vein is noted. Small scattered mesenteric and retroperitoneal lymph nodes, likely reactive. No mass or overt adenopathy. Other: No pelvic mass or adenopathy. No free pelvic fluid collections. No inguinal mass or adenopathy.  No abdominal wall hernia or subcutaneous lesions. Prominent left inguinal ring containing fat, likely inguinal hernia. Musculoskeletal: Age advanced degenerative lumbar spondylosis with multilevel disc disease and facet disease. No acute bony findings. IMPRESSION: 1. CT findings consistent with acute complicated diverticulitis involving the upper sigmoid colon with perforation and free air. No abscess. 2. No other significant abdominal findings, mass lesion or adenopathy. 3. Age advanced degenerative lumbar spondylosis with multilevel disc disease and facet disease. Electronically Signed   By: Marijo Sanes M.D.   On: 03/14/2020 17:35  Scheduled Meds: . cetirizine  10 mg Oral QHS  . oxymetazoline  1 spray Each Nare BID  . pantoprazole  40 mg Oral Daily   Continuous Infusions: . dextrose 5 % and 0.9% NaCl 125 mL/hr at 03/15/20 0726  . meropenem (MERREM) IV 1 g (03/15/20 1820)     LOS: 1 day    Time spent: 35 minutes    Irine Seal, MD [facility]   To contact the attending provider between 7A-7P or the covering provider during after hours 7P-7A, please log into the web site [email or web address] and access using universal [place] password for that web site. If you do not have the password, please call the hospital operator.  03/15/2020, 8:51 PM

## 2020-03-15 NOTE — Plan of Care (Signed)

## 2020-03-15 NOTE — Progress Notes (Signed)
[facility] Progress Note     Subjective: CC-  Feeling much better this morning. Rates his abdominal pain as 2/10. Denies n/v. Passing flatus, no BM.  WBC down 9.7. afebrile so far today.  Objective: Vital signs in last 24 hours: Temp:  [98.3 F (36.8 C)-103 F (39.4 C)] 98.3 F (36.8 C) (08/04 0219) Pulse Rate:  [70-112] 81 (08/04 1000) Resp:  [18-20] 18 (08/04 1000) BP: (101-137)/(63-97) 113/68 (08/04 1000) SpO2:  [94 %-100 %] 98 % (08/04 1000) Weight:  [86.2 kg] 86.2 kg (08/03 1543)    Intake/Output from previous day: 08/03 0701 - 08/04 0700 In: 1200 [IV Piggyback:1200] Out: -  Intake/Output this shift: Total I/O In: 100 [IV Piggyback:100] Out: -   PE: Gen:  Alert, NAD, pleasant Card:  RRR Pulm:  CTAB, no W/R/R, rate and effort normal Abd: Soft, ND, +BS, no HSM, minimal LLQ TTP without rebound or guarding Ext:  no BUE/BLE edema, calves soft and nontender Psych: A&Ox4  Skin: no rashes noted, warm and dry  Lab Results:  Recent Labs    03/14/20 1602 03/15/20 0601  WBC 11.6* 9.7  HGB 15.2 13.8  HCT 47.8 42.1  PLT 329 266   BMET Recent Labs    03/14/20 1602 03/15/20 0601  NA 138 139  K 4.0 4.2  CL 101 105  CO2 25 24  GLUCOSE 115* 99  BUN 16 16  CREATININE 1.04 1.01  CALCIUM 8.7* 8.6*   PT/INR No results for input(s): LABPROT, INR in the last 72 hours. CMP     Component Value Date/Time   NA 139 03/15/2020 0601   K 4.2 03/15/2020 0601   CL 105 03/15/2020 0601   CO2 24 03/15/2020 0601   GLUCOSE 99 03/15/2020 0601   BUN 16 03/15/2020 0601   CREATININE 1.01 03/15/2020 0601   CALCIUM 8.6 (L) 03/15/2020 0601   PROT 7.7 03/14/2020 1602   ALBUMIN 4.0 03/14/2020 1602   AST 28 03/14/2020 1602   ALT 50 (H) 03/14/2020 1602   ALKPHOS 60 03/14/2020 1602   BILITOT 0.7 03/14/2020 1602   GFRNONAA >60 03/15/2020 0601   GFRAA >60 03/15/2020 0601   Lipase     Component Value Date/Time   LIPASE 29 03/14/2020 1602        Studies/Results: CT ABDOMEN W CONTRAST  Result Date: 03/14/2020 CLINICAL DATA:  Left lower quadrant abdominal pain for 3 days. EXAM: CT ABDOMEN WITH CONTRAST TECHNIQUE: Multidetector CT imaging of the abdomen was performed using the standard protocol following bolus administration of intravenous contrast. CONTRAST:  149mL OMNIPAQUE IOHEXOL 300 MG/ML  SOLN COMPARISON:  06/22/2018 FINDINGS: Lower chest: The lung bases are clear of acute process. No pleural effusion or pulmonary lesions. The heart is normal in size. No pericardial effusion. The distal esophagus and aorta are unremarkable. Hepatobiliary: No hepatic lesions or intrahepatic biliary dilatation. The gallbladder appears normal. No common bile duct dilatation. Pancreas: No mass, inflammation or ductal dilatation. Spleen: Normal size.  No focal lesions. Adrenals/Urinary Tract: The adrenal glands and kidneys are unremarkable. The bladder appears normal. Stomach/Bowel: The stomach, duodenum and small bowel are unremarkable. No acute inflammatory process, mass lesion or obstructive finding. The terminal ileum is normal. The appendix is normal. Diffuse colonic diverticulosis is noted. Focal area of complicated diverticulitis involving the upper sigmoid colon. Evidence of perforation with small collection of free air anterior to the sigmoid colon. There is also scattered free air in the abdomen anteriorly. No abscess is identified. Vascular/Lymphatic: The aorta is  normal in caliber. No atherosclerotic calcifications, aneurysm or dissection. The branch vessels are patent. The major venous structures are patent. Retroaortic left renal vein is noted. Small scattered mesenteric and retroperitoneal lymph nodes, likely reactive. No mass or overt adenopathy. Other: No pelvic mass or adenopathy. No free pelvic fluid collections. No inguinal mass or adenopathy. No abdominal wall hernia or subcutaneous lesions. Prominent left inguinal ring containing fat,  likely inguinal hernia. Musculoskeletal: Age advanced degenerative lumbar spondylosis with multilevel disc disease and facet disease. No acute bony findings. IMPRESSION: 1. CT findings consistent with acute complicated diverticulitis involving the upper sigmoid colon with perforation and free air. No abscess. 2. No other significant abdominal findings, mass lesion or adenopathy. 3. Age advanced degenerative lumbar spondylosis with multilevel disc disease and facet disease. Electronically Signed   By: Marijo Sanes M.D.   On: 03/14/2020 17:35    Anti-infectives: Anti-infectives (From admission, onward)   Start     Dose/Rate Route Frequency Ordered Stop   03/14/20 1830  meropenem (MERREM) 1 g in sodium chloride 0.9 % 100 mL IVPB     Discontinue     1 g 200 mL/hr over 30 Minutes Intravenous Every 8 hours 03/14/20 1824     03/14/20 1745  ciprofloxacin (CIPRO) IVPB 400 mg       "And" Linked Group Details   400 mg 200 mL/hr over 60 Minutes Intravenous  Once 03/14/20 1743 03/14/20 1948   03/14/20 1745  metroNIDAZOLE (FLAGYL) IVPB 500 mg  Status:  Discontinued       "And" Linked Group Details   500 mg 100 mL/hr over 60 Minutes Intravenous  Once 03/14/20 1743 03/14/20 1822       Assessment/Plan  Acute diverticulitis with microperforation - last colonoscopy 01/13/2019 Dr. Henrene Pastor: 1 polyp in cecum, Multiple small and large-mouthed diverticula were found in the distal transverse colon and left colon - CT 8/3 shows acute complicated diverticulitis involving the upper sigmoid colon with perforation and free air, no abscess  ID - meropenem 8/4>> FEN - IVF, CLD VTE - SCDs, ok for chemical DVT prophylaxis from surgical standpoint Foley - none Follow up - TBD  Plan - Seems to already be improving. Abdominal pain is less and leukocytosis resolved. Ok for clear liquids. Continue IV antibiotics. Repeat labs in AM.   LOS: 1 day    Wellington Hampshire, [facility] 03/15/2020, 10:23  AM Please see Amion for pager number during day hours 7:00am-4:30pm

## 2020-03-16 LAB — CBC
HCT: 43 % (ref 39.0–52.0)
Hemoglobin: 13.7 g/dL (ref 13.0–17.0)
MCH: 30.7 pg (ref 26.0–34.0)
MCHC: 31.9 g/dL (ref 30.0–36.0)
MCV: 96.4 fL (ref 80.0–100.0)
Platelets: 272 10*3/uL (ref 150–400)
RBC: 4.46 MIL/uL (ref 4.22–5.81)
RDW: 13.6 % (ref 11.5–15.5)
WBC: 7 10*3/uL (ref 4.0–10.5)
nRBC: 0 % (ref 0.0–0.2)

## 2020-03-16 LAB — BASIC METABOLIC PANEL
Anion gap: 7 (ref 5–15)
BUN: 12 mg/dL (ref 8–23)
CO2: 26 mmol/L (ref 22–32)
Calcium: 8.6 mg/dL — ABNORMAL LOW (ref 8.9–10.3)
Chloride: 103 mmol/L (ref 98–111)
Creatinine, Ser: 1.08 mg/dL (ref 0.61–1.24)
GFR calc Af Amer: >60 mL/min (ref >60)
GFR calc non Af Amer: >60 mL/min (ref >60)
Glucose, Bld: 103 mg/dL — ABNORMAL HIGH (ref 70–99)
Potassium: 4.2 mmol/L (ref 3.5–5.1)
Sodium: 136 mmol/L (ref 135–145)

## 2020-03-16 LAB — MAGNESIUM: Magnesium: 1.9 mg/dL (ref 1.7–2.4)

## 2020-03-16 MED ORDER — OXYCODONE-ACETAMINOPHEN 5-325 MG PO TABS: 1.0000 | ORAL_TABLET | ORAL | Status: DC | PRN

## 2020-03-16 MED ORDER — ENOXAPARIN SODIUM 40 MG/0.4ML ~~LOC~~ SOLN
40.0000 mg | SUBCUTANEOUS | Status: DC
  Filled 2020-03-16 (×2): qty 0.4

## 2020-03-16 MED ORDER — SODIUM CHLORIDE 0.9 % IV SOLN
INTRAVENOUS | Status: DC | PRN
  Administered 2020-03-16: 500 mL via INTRAVENOUS

## 2020-03-16 NOTE — Progress Notes (Signed)
PROGRESS NOTE    Patrick [name]  JGG:[id number] DOB: 16-Oct-1955 DOA: 03/14/2020 PCP: Shon Baton, MD    Chief Complaint  Patient presents with  . Abdominal Pain    Brief Narrative: HPI per Dr. Kallie Edward is a 64 y.o. male with no significant past medical history presents to the ER with complaint of worsening abdominal pain.  Patient has been having abdominal pain for the last 3 days mostly in the lower quadrant constant with no associated food.  Denies any diarrhea or nausea or vomiting.  Subjective feeling of fever chills.  Patient had gone to urgent care center 2 days ago and was prescribed antibiotics despite taking which patient's pain worsened.  ED Course: In the ER patient was febrile with temperature 103 F and CT scan of the abdomen pelvis shows features consistent with sigmoid diverticulitis with microperforation.  On-call general surgery was consulted patient started on meropenem.  Admitted for further management.  Labs are significant for WBC count of 11.6 Covid test negative.   Assessment & Plan:   Principal Problem:   Diverticulitis of colon with perforation Active Problems:   Diverticulitis   Gastroesophageal reflux disease  1 complicated acute diverticulitis with microperforation Patient presented to the ED with 3-day history of worsening lower quadrant abdominal pain, subjective fever and chills, noted to have a temperature of 103 F in the ED CT abdomen with features consistent with sigmoid diverticulitis with microperforation.  Patient seen in consultation by general surgery .  Patient improving clinically.  Abdominal pain improving.  Passing flatus.  No nausea or vomiting.  Tolerated clears and has been advanced to full liquid diet which he tolerated for breakfast.  Leukocytosis trending down.  Afebrile. Continue IV antibiotics.  Pain management.  Supportive care.  Appreciate general surgery input and recommendations.  2.  Gastroesophageal reflux  disease Continue PPI.    DVT prophylaxis: Lovenox Code Status: Full Family Communication: Updated patient.  No family at bedside. Disposition:   Status is: Inpatient    Dispo: The patient is from: Home              Anticipated d/c is to: Home              Anticipated d/c date is: 1-2 days.              Patient currently receiving IV antibiotics: Acute diverticulitis with perforation.  Not stable for discharge.       Consultants:   General surgery: Dr. Rosendo Gros 03/14/2020  Procedures:   CT abdomen  03/14/2020  Antimicrobials:   IV Flagyl 03/14/2020>>>> 03/14/2020  IV Merrem 03/14/2020>>>>   Subjective: Patient sitting up in chair.  Tolerated clear liquids yesterday.  Advance to full liquid diet this morning which he tolerated.  No chest pain.  No shortness of breath.  Abdominal pain improving.  Passing flatus.  States he almost had a bowel movement this morning.  Overall feeling better.   Objective: Vitals:   03/15/20 1640 03/15/20 1707 03/15/20 2045 03/16/20 0633  BP: 117/73 127/76 113/72 105/66  Pulse: 80 81 86 72  Resp: 20  18 18   Temp:  98.7 F (37.1 C) 99.9 F (37.7 C) 98.2 F (36.8 C)  TempSrc:  Oral Oral Oral  SpO2: 94% 100% 97% 99%  Weight:      Height:        Intake/Output Summary (Last 24 hours) at 03/16/2020 1008 Last data filed at 03/16/2020 0959 Gross per 24 hour  Intake  2932.5 ml  Output 0 ml  Net 2932.5 ml   Filed Weights   03/14/20 1543  Weight: 86.2 kg    Examination:  General exam: NAD Respiratory system: CTAB.  No wheezes, no crackles, no rhonchi.  Normal respiratory effort.  Speaking in full sentences.   Cardiovascular system: Regular rate rhythm no murmurs rubs or gallops.  No JVD.  No lower extremity edema. Gastrointestinal system: Abdomen is soft, nondistended, decreased tenderness to palpation the left lower quadrant and suprapubic region.  Positive bowel sounds.  No rebound.  No guarding.  Central nervous system: Alert and oriented.  No focal neurological deficits. Extremities: Symmetric 5 x 5 power. Skin: No rashes, lesions or ulcers Psychiatry: Judgement and insight appear normal. Mood & affect appropriate.     Data Reviewed: I have personally reviewed following labs and imaging studies  CBC: Recent Labs  Lab 03/14/20 1602 03/15/20 0601 03/16/20 0511  WBC 11.6* 9.7 7.0  NEUTROABS 10.3*  --   --   HGB 15.2 13.8 13.7  HCT 47.8 42.1 43.0  MCV 95.8 96.8 96.4  PLT 329 266 956    Basic Metabolic Panel: Recent Labs  Lab 03/14/20 1602 03/15/20 0601 03/16/20 0511  NA 138 139 136  K 4.0 4.2 4.2  CL 101 105 103  CO2 25 24 26   GLUCOSE 115* 99 103*  BUN 16 16 12   CREATININE 1.04 1.01 1.08  CALCIUM 8.7* 8.6* 8.6*  MG  --   --  1.9    GFR: Estimated Creatinine Clearance: 81.2 mL/min (by C-G formula based on SCr of 1.08 mg/dL).  Liver Function Tests: Recent Labs  Lab 03/14/20 1602  AST 28  ALT 50*  ALKPHOS 60  BILITOT 0.7  PROT 7.7  ALBUMIN 4.0    CBG: No results for input(s): GLUCAP in the last 168 hours.   Recent Results (from the past 240 hour(s))  Blood culture (routine x 2)     Status: None (Preliminary result)   Collection Time: 03/14/20  3:55 PM   Specimen: BLOOD  Result Value Ref Range Status   Specimen Description   Final    BLOOD BLOOD RIGHT ARM Performed at [facility], [address] [place], [place] [address]    Special Requests   Final    BOTTLES DRAWN AEROBIC ONLY Blood Culture results may not be optimal due to an inadequate volume of blood received in culture bottles Performed at [facility], [address] [place], [place] [address]    Culture   Final    NO GROWTH < 12 HOURS Performed at [facility], [place] [address], [place], [place] [address]    Report Status PENDING  Incomplete  Blood culture (routine x 2)     Status: None (Preliminary result)   Collection Time: 03/14/20  4:05 PM   Specimen: BLOOD  Result Value Ref Range Status    Specimen Description   Final    BLOOD RIGHT ANTECUBITAL Performed at [facility], [address] [place], [place] [address]    Special Requests   Final    BOTTLES DRAWN AEROBIC AND ANAEROBIC Blood Culture adequate volume Performed at [facility], [address] [place], [place] [address]    Culture   Final    NO GROWTH < 12 HOURS Performed at [facility], [place] [address], [place], [place] [address]    Report Status PENDING  Incomplete  SARS  Coronavirus 2 by RT PCR (hospital order, performed in [facility] hospital lab) Nasopharyngeal Nasopharyngeal Swab     Status: None   Collection Time: 03/14/20  5:44 PM   Specimen: Nasopharyngeal Swab  Result Value Ref Range Status   SARS Coronavirus 2 NEGATIVE NEGATIVE Final    Comment: (NOTE) SARS-CoV-2 target nucleic acids are NOT DETECTED.  The SARS-CoV-2 RNA is generally detectable in upper and lower respiratory specimens during the acute phase of infection. The lowest concentration of SARS-CoV-2 viral copies this assay can detect is 250 copies / mL. A negative result does not preclude SARS-CoV-2 infection and should not be used as the sole basis for treatment or other patient management decisions.  A negative result may occur with improper specimen collection / handling, submission of specimen other than nasopharyngeal swab, presence of viral mutation(s) within the areas targeted by this assay, and inadequate number of viral copies (<250 copies / mL). A negative result must be combined with clinical observations, patient history, and epidemiological information.  Fact Sheet for Patients:   StrictlyIdeas.no  Fact Sheet for Healthcare Providers: BankingDealers.co.za  This test is not yet approved or  cleared by the [place] FDA and has been authorized for detection and/or diagnosis of SARS-CoV-2 by FDA under an Emergency Use Authorization (EUA).  This  EUA will remain in effect (meaning this test can be used) for the duration of the COVID-19 declaration under Section 564(b)(1) of the Act, 21 U.S.C. section 360bbb-3(b)(1), unless the authorization is terminated or revoked sooner.  Performed at [facility], [address] [place], [place] [address]          Radiology Studies: CT ABDOMEN W CONTRAST  Result Date: 03/14/2020 CLINICAL DATA:  Left lower quadrant abdominal pain for 3 days. EXAM: CT ABDOMEN WITH CONTRAST TECHNIQUE: Multidetector CT imaging of the abdomen was performed using the standard protocol following bolus administration of intravenous contrast. CONTRAST:  150mL OMNIPAQUE IOHEXOL 300 MG/ML  SOLN COMPARISON:  06/22/2018 FINDINGS: Lower chest: The lung bases are clear of acute process. No pleural effusion or pulmonary lesions. The heart is normal in size. No pericardial effusion. The distal esophagus and aorta are unremarkable. Hepatobiliary: No hepatic lesions or intrahepatic biliary dilatation. The gallbladder appears normal. No common bile duct dilatation. Pancreas: No mass, inflammation or ductal dilatation. Spleen: Normal size.  No focal lesions. Adrenals/Urinary Tract: The adrenal glands and kidneys are unremarkable. The bladder appears normal. Stomach/Bowel: The stomach, duodenum and small bowel are unremarkable. No acute inflammatory process, mass lesion or obstructive finding. The terminal ileum is normal. The appendix is normal. Diffuse colonic diverticulosis is noted. Focal area of complicated diverticulitis involving the upper sigmoid colon. Evidence of perforation with small collection of free air anterior to the sigmoid colon. There is also scattered free air in the abdomen anteriorly. No abscess is identified. Vascular/Lymphatic: The aorta is normal in caliber. No atherosclerotic calcifications, aneurysm or dissection. The branch vessels are patent. The major venous structures are patent. Retroaortic left  renal vein is noted. Small scattered mesenteric and retroperitoneal lymph nodes, likely reactive. No mass or overt adenopathy. Other: No pelvic mass or adenopathy. No free pelvic fluid collections. No inguinal mass or adenopathy. No abdominal wall hernia or subcutaneous lesions. Prominent left inguinal ring containing fat, likely inguinal hernia. Musculoskeletal: Age advanced degenerative lumbar spondylosis with multilevel disc disease and facet disease. No acute bony findings. IMPRESSION: 1. CT findings consistent with acute complicated diverticulitis involving the upper sigmoid colon with perforation and  free air. No abscess. 2. No other significant abdominal findings, mass lesion or adenopathy. 3. Age advanced degenerative lumbar spondylosis with multilevel disc disease and facet disease. Electronically Signed   By: Marijo Sanes M.D.   On: 03/14/2020 17:35        Scheduled Meds: . cetirizine  10 mg Oral QHS  . enoxaparin (LOVENOX) injection  40 mg Subcutaneous Q24H  . oxymetazoline  1 spray Each Nare BID  . pantoprazole  40 mg Oral Daily   Continuous Infusions: . sodium chloride 500 mL (03/16/20 0228)  . meropenem (MERREM) IV 1 g (03/16/20 2979)     LOS: 2 days    Time spent: 35 minutes    Irine Seal, MD [facility]   To contact the attending provider between 7A-7P or the covering provider during after hours 7P-7A, please log into the web site [email or web address] and access using universal [place] password for that web site. If you do not have the password, please call the hospital operator.  03/16/2020, 10:08 AM

## 2020-03-16 NOTE — Plan of Care (Signed)
  Problem: Education: Goal: Knowledge of General Education information will improve Description: Including pain rating scale, medication(s)/side effects and non-pharmacologic comfort measures Outcome: Progressing   Problem: Health Behavior/Discharge Planning: Goal: Ability to manage health-related needs will improve Outcome: Progressing   Problem: Pain Managment: Goal: General experience of comfort will improve Outcome: Progressing   

## 2020-03-16 NOTE — Progress Notes (Signed)
PHARMACY NOTE -  Meropenem  Pharmacy has been assisting with dosing of meropenem for IAI.  Dosage remains stable at 1g IV q8 hr and need for further dosage adjustment appears unlikely at present given SCr at baseline  D3 abx, Surgery anticipating discharge on PO abx in 1-2 days  Pharmacy will sign off, following peripherally for culture results or dose adjustments. Please reconsult if a change in clinical status warrants re-evaluation of dosage.  Reuel Boom, PharmD, BCPS [phone] 03/16/2020, 10:09 AM

## 2020-03-16 NOTE — Progress Notes (Signed)
[facility] Progress Note     Subjective: CC-  Up in chair. Feeling better each day. He reports less LLQ abdominal pain. Denies n/v. Tolerating clear liquids. Passing flatus, no BM. WBC 7, TMAX 99.9  Objective: Vital signs in last 24 hours: Temp:  [98.2 F (36.8 C)-99.9 F (37.7 C)] 98.2 F (36.8 C) (08/05 9811) Pulse Rate:  [70-86] 72 (08/05 0633) Resp:  [18-20] 18 (08/05 0633) BP: (105-127)/(63-76) 105/66 (08/05 0633) SpO2:  [94 %-100 %] 99 % (08/05 9147)    Intake/Output from previous day: 08/04 0701 - 08/05 0700 In: 2432.5 [P.O.:720; I.V.:1412.5; IV Piggyback:300] Out: 0  Intake/Output this shift: No intake/output data recorded.  PE: Gen:  Alert, NAD, pleasant Pulm:  rate and effort normal Abd: Soft, ND, no HSM, nontender Ext:  no BUE/BLE edema, calves soft and nontender Psych: A&Ox4  Skin: no rashes noted, warm and dry   Lab Results:  Recent Labs    03/15/20 0601 03/16/20 0511  WBC 9.7 7.0  HGB 13.8 13.7  HCT 42.1 43.0  PLT 266 272   BMET Recent Labs    03/15/20 0601 03/16/20 0511  NA 139 136  K 4.2 4.2  CL 105 103  CO2 24 26  GLUCOSE 99 103*  BUN 16 12  CREATININE 1.01 1.08  CALCIUM 8.6* 8.6*   PT/INR No results for input(s): LABPROT, INR in the last 72 hours. CMP     Component Value Date/Time   NA 136 03/16/2020 0511   K 4.2 03/16/2020 0511   CL 103 03/16/2020 0511   CO2 26 03/16/2020 0511   GLUCOSE 103 (H) 03/16/2020 0511   BUN 12 03/16/2020 0511   CREATININE 1.08 03/16/2020 0511   CALCIUM 8.6 (L) 03/16/2020 0511   PROT 7.7 03/14/2020 1602   ALBUMIN 4.0 03/14/2020 1602   AST 28 03/14/2020 1602   ALT 50 (H) 03/14/2020 1602   ALKPHOS 60 03/14/2020 1602   BILITOT 0.7 03/14/2020 1602   GFRNONAA >60 03/16/2020 0511   GFRAA >60 03/16/2020 0511   Lipase     Component Value Date/Time   LIPASE 29 03/14/2020 1602       Studies/Results: CT ABDOMEN W CONTRAST  Result Date: 03/14/2020 CLINICAL DATA:  Left lower  quadrant abdominal pain for 3 days. EXAM: CT ABDOMEN WITH CONTRAST TECHNIQUE: Multidetector CT imaging of the abdomen was performed using the standard protocol following bolus administration of intravenous contrast. CONTRAST:  135mL OMNIPAQUE IOHEXOL 300 MG/ML  SOLN COMPARISON:  06/22/2018 FINDINGS: Lower chest: The lung bases are clear of acute process. No pleural effusion or pulmonary lesions. The heart is normal in size. No pericardial effusion. The distal esophagus and aorta are unremarkable. Hepatobiliary: No hepatic lesions or intrahepatic biliary dilatation. The gallbladder appears normal. No common bile duct dilatation. Pancreas: No mass, inflammation or ductal dilatation. Spleen: Normal size.  No focal lesions. Adrenals/Urinary Tract: The adrenal glands and kidneys are unremarkable. The bladder appears normal. Stomach/Bowel: The stomach, duodenum and small bowel are unremarkable. No acute inflammatory process, mass lesion or obstructive finding. The terminal ileum is normal. The appendix is normal. Diffuse colonic diverticulosis is noted. Focal area of complicated diverticulitis involving the upper sigmoid colon. Evidence of perforation with small collection of free air anterior to the sigmoid colon. There is also scattered free air in the abdomen anteriorly. No abscess is identified. Vascular/Lymphatic: The aorta is normal in caliber. No atherosclerotic calcifications, aneurysm or dissection. The branch vessels are patent. The major venous structures are patent. Retroaortic  left renal vein is noted. Small scattered mesenteric and retroperitoneal lymph nodes, likely reactive. No mass or overt adenopathy. Other: No pelvic mass or adenopathy. No free pelvic fluid collections. No inguinal mass or adenopathy. No abdominal wall hernia or subcutaneous lesions. Prominent left inguinal ring containing fat, likely inguinal hernia. Musculoskeletal: Age advanced degenerative lumbar spondylosis with multilevel disc  disease and facet disease. No acute bony findings. IMPRESSION: 1. CT findings consistent with acute complicated diverticulitis involving the upper sigmoid colon with perforation and free air. No abscess. 2. No other significant abdominal findings, mass lesion or adenopathy. 3. Age advanced degenerative lumbar spondylosis with multilevel disc disease and facet disease. Electronically Signed   By: Marijo Sanes M.D.   On: 03/14/2020 17:35    Anti-infectives: Anti-infectives (From admission, onward)   Start     Dose/Rate Route Frequency Ordered Stop   03/15/20 1600  meropenem (MERREM) 1 g in sodium chloride 0.9 % 100 mL IVPB     Discontinue     1 g 200 mL/hr over 30 Minutes Intravenous Every 8 hours 03/15/20 1559     03/14/20 1830  meropenem (MERREM) 1 g in sodium chloride 0.9 % 100 mL IVPB  Status:  Discontinued        1 g 200 mL/hr over 30 Minutes Intravenous Every 8 hours 03/14/20 1824 03/15/20 1559   03/14/20 1745  ciprofloxacin (CIPRO) IVPB 400 mg       "And" Linked Group Details   400 mg 200 mL/hr over 60 Minutes Intravenous  Once 03/14/20 1743 03/14/20 1948   03/14/20 1745  metroNIDAZOLE (FLAGYL) IVPB 500 mg  Status:  Discontinued       "And" Linked Group Details   500 mg 100 mL/hr over 60 Minutes Intravenous  Once 03/14/20 1743 03/14/20 1822       Assessment/Plan  Acute diverticulitis with microperforation - last colonoscopy 01/13/2019 Dr. Henrene Pastor: 1 polyp in cecum, Multiple small and large-mouthed diverticula were found in the distal transverse colon and left colon - CT 8/3 shows acute complicated diverticulitis involving the upper sigmoid colon with perforation and free air, no abscess  ID - meropenem 8/4>>day#2 FEN - IVF, FLD VTE - SCDs, ok for chemical DVT prophylaxis from surgical standpoint Foley - none Follow up - TBD  Plan - Continues to improve. Advance to full liquids. Continue IV antibiotics. Repeat labs in AM. May be ready for discharge on oral antibiotics in 36-48  hours.   LOS: 2 days    [facility] 03/16/2020, 8:30 AM Please see Amion for pager number during day hours 7:00am-4:30pm

## 2020-03-17 DIAGNOSIS — K5720 Diverticulitis of large intestine with perforation and abscess without bleeding: Secondary | ICD-10-CM

## 2020-03-17 LAB — CBC
HCT: 45.7 % (ref 39.0–52.0)
Hemoglobin: 14.4 g/dL (ref 13.0–17.0)
MCH: 31 pg (ref 26.0–34.0)
MCHC: 31.5 g/dL (ref 30.0–36.0)
MCV: 98.5 fL (ref 80.0–100.0)
Platelets: 288 10*3/uL (ref 150–400)
RBC: 4.64 MIL/uL (ref 4.22–5.81)
RDW: 13.3 % (ref 11.5–15.5)
WBC: 5.3 10*3/uL (ref 4.0–10.5)
nRBC: 0 % (ref 0.0–0.2)

## 2020-03-17 LAB — BASIC METABOLIC PANEL
Anion gap: 9 (ref 5–15)
BUN: 13 mg/dL (ref 8–23)
CO2: 25 mmol/L (ref 22–32)
Calcium: 8.9 mg/dL (ref 8.9–10.3)
Chloride: 105 mmol/L (ref 98–111)
Creatinine, Ser: 0.97 mg/dL (ref 0.61–1.24)
GFR calc Af Amer: >60 mL/min (ref >60)
GFR calc non Af Amer: >60 mL/min (ref >60)
Glucose, Bld: 97 mg/dL (ref 70–99)
Potassium: 4 mmol/L (ref 3.5–5.1)
Sodium: 139 mmol/L (ref 135–145)

## 2020-03-17 MED ORDER — METRONIDAZOLE 500 MG PO TABS
500.0000 mg | ORAL_TABLET | Freq: Three times a day (TID) | ORAL | Status: DC
  Administered 2020-03-17: 500 mg via ORAL
  Filled 2020-03-17: qty 1

## 2020-03-17 MED ORDER — SACCHAROMYCES BOULARDII 250 MG PO CAPS
250.0000 mg | ORAL_CAPSULE | Freq: Two times a day (BID) | ORAL | Status: DC
  Administered 2020-03-17: 250 mg via ORAL
  Filled 2020-03-17: qty 1

## 2020-03-17 MED ORDER — CIPROFLOXACIN HCL 500 MG PO TABS: 500.0000 mg | ORAL_TABLET | Freq: Two times a day (BID) | ORAL | 0 refills | Status: AC

## 2020-03-17 MED ORDER — POLYETHYLENE GLYCOL 3350 17 G PO PACK: 17.0000 g | PACK | Freq: Every day | ORAL | Status: DC | PRN

## 2020-03-17 MED ORDER — POLYETHYLENE GLYCOL 3350 17 G PO PACK: 17.0000 g | PACK | Freq: Every day | ORAL | 0 refills | Status: DC | PRN

## 2020-03-17 MED ORDER — SENNOSIDES-DOCUSATE SODIUM 8.6-50 MG PO TABS: 1.0000 | ORAL_TABLET | Freq: Two times a day (BID) | ORAL | Status: DC

## 2020-03-17 MED ORDER — CIPROFLOXACIN HCL 500 MG PO TABS
500.0000 mg | ORAL_TABLET | Freq: Two times a day (BID) | ORAL | Status: DC
  Administered 2020-03-17: 500 mg via ORAL
  Filled 2020-03-17: qty 1

## 2020-03-17 MED ORDER — METRONIDAZOLE IN NACL 5-0.79 MG/ML-% IV SOLN
500.0000 mg | Freq: Three times a day (TID) | INTRAVENOUS | Status: DC
  Administered 2020-03-17: 500 mg via INTRAVENOUS
  Filled 2020-03-17: qty 100

## 2020-03-17 MED ORDER — METRONIDAZOLE 500 MG PO TABS: 500.0000 mg | ORAL_TABLET | Freq: Three times a day (TID) | ORAL | 0 refills | Status: AC

## 2020-03-17 MED ORDER — TRAMADOL HCL 50 MG PO TABS: 100.0000 mg | ORAL_TABLET | Freq: Four times a day (QID) | ORAL | 0 refills | Status: DC | PRN

## 2020-03-17 MED ORDER — SACCHAROMYCES BOULARDII 250 MG PO CAPS: 250.0000 mg | ORAL_CAPSULE | Freq: Two times a day (BID) | ORAL | 0 refills | Status: AC

## 2020-03-17 NOTE — Progress Notes (Signed)
    CC: Abdominal pain  Subjective: Patient is feeling much better this a.m.  Minimal left lower quadrant abdominal pain.  Tolerating full liquids well.  He had a bowel movement and says initially hard and he has had some problems with intermittent constipation.  He blames his current bout on constipation.  This is his second episode first episode was treated from the ED with antibiotics without hospitalization.  He would like to go home later today if possible.  Objective: Vital signs in last 24 hours: Temp:  [98 F (36.7 C)-98.7 F (37.1 C)] 98.5 F (36.9 C) (08/06 0525) Pulse Rate:  [50-88] 50 (08/06 0525) Resp:  [17-18] 17 (08/06 0525) BP: (119-123)/(73-78) 123/73 (08/06 0525) SpO2:  [99 %-100 %] 99 % (08/06 0525) Last BM Date: 03/15/20 1320 p.o. 444 IV Urine x4 Afebrile vital signs are stable BMP is normal. WBC 5.3 CT abdomen 8/3: Acute complicated diverticulitis involving the upper sigmoid colon with perforation and free air.  No abscess Intake/Output from previous day: 08/05 0701 - 08/06 0700 In: 1764.6 [P.O.:1320; I.V.:44.6; IV Piggyback:400] Out: -  Intake/Output this shift: No intake/output data recorded.  General appearance: alert, cooperative and no distress Resp: clear to auscultation bilaterally GI: Soft, minimal tenderness left lower quadrant.  Positive bowel sounds, positive BM.  Lab Results:  Recent Labs    03/16/20 0511 03/17/20 0437  WBC 7.0 5.3  HGB 13.7 14.4  HCT 43.0 45.7  PLT 272 288    BMET Recent Labs    03/16/20 0511 03/17/20 0437  NA 136 139  K 4.2 4.0  CL 103 105  CO2 26 25  GLUCOSE 103* 97  BUN 12 13  CREATININE 1.08 0.97  CALCIUM 8.6* 8.9   PT/INR No results for input(s): LABPROT, INR in the last 72 hours.  Recent Labs  Lab 03/14/20 1602  AST 28  ALT 50*  ALKPHOS 60  BILITOT 0.7  PROT 7.7  ALBUMIN 4.0     Lipase     Component Value Date/Time   LIPASE 29 03/14/2020 1602     Medications: . cetirizine  10  mg Oral QHS  . enoxaparin (LOVENOX) injection  40 mg Subcutaneous Q24H  . oxymetazoline  1 spray Each Nare BID  . pantoprazole  40 mg Oral Daily    Assessment/Plan Acute diverticulitis with microperforation -last colonoscopy 01/13/2019 Dr. Henrene Pastor: 1 polyp in cecum,Multiple small and large-mouthed diverticula were found in the distal transverse colon and left colon - CT 8/3 showsacute complicated diverticulitis involving the upper sigmoid colon with perforation and free air, no abscess  ID -meropenem 8/4>>day#3 FEN -IVF, FLD VTE -SCDs, ok for chemical DVT prophylaxis from surgical standpoint Foley -none Follow up -TBD  Plan- he would like to go home today if possible.  I will put him on a soft diet, switch him over to p.o. Cipro/Flagyl.  His primary care is Dr. Virgina Jock, his last colonoscopy was last year.  I have told him to follow-up with Dr. Virgina Jock and arrange a follow-up colonoscopy in 6 to 8 weeks.  He can then be referred to our office if they think he needs to be evaluated for partial colectomy.  He has had issues with constipation and I am going to put a good bowel habit information in his discharge instructions.  I am also adding a probiotic.           LOS: 3 days    Barney Russomanno 03/17/2020 Please see Amion

## 2020-03-17 NOTE — Progress Notes (Signed)
PROGRESS NOTE    Patrick [name]  ERX:[id number] DOB: 04-11-1956 DOA: 03/14/2020 PCP: Shon Baton, MD    Chief Complaint  Patient presents with  . Abdominal Pain    Brief Narrative: HPI per Dr. Kallie Edward is a 64 y.o. male with no significant past medical history presents to the ER with complaint of worsening abdominal pain.  Patient has been having abdominal pain for the last 3 days mostly in the lower quadrant constant with no associated food.  Denies any diarrhea or nausea or vomiting.  Subjective feeling of fever chills.  Patient had gone to urgent care center 2 days ago and was prescribed antibiotics despite taking which patient's pain worsened.  ED Course: In the ER patient was febrile with temperature 103 F and CT scan of the abdomen pelvis shows features consistent with sigmoid diverticulitis with microperforation.  On-call general surgery was consulted patient started on meropenem.  Admitted for further management.  Labs are significant for WBC count of 11.6 Covid test negative.   Assessment & Plan:   Principal Problem:   Diverticulitis of colon with perforation Active Problems:   Diverticulitis   Gastroesophageal reflux disease  1 complicated acute diverticulitis with microperforation Patient presented to the ED with 3-day history of worsening lower quadrant abdominal pain, subjective fever and chills, noted to have a temperature of 103 F in the ED CT abdomen with features consistent with sigmoid diverticulitis with microperforation.  Patient seen in consultation by general surgery .  Patient improving clinically.  Abdominal pain improved.  Passing flatus.  Had bowel movement this morning which was initially felt hard and subsequent bowel movements softer.  Tolerating coffee.  Tolerating full liquid diet.  Afebrile.  IV antibiotics have been transitioned to oral ciprofloxacin and oral Flagyl.  General surgery.  Diet has been advanced to a soft diet.  Continue  supportive care.  Will need outpatient follow-up with PCP and GI for probable repeat colonoscopy in about 6 to 8 weeks.  General surgery following and appreciate input and recommendations.   2.  Gastroesophageal reflux disease Continue PPI.    DVT prophylaxis: Lovenox Code Status: Full Family Communication: Updated patient.  No family at bedside. Disposition:   Status is: Inpatient    Dispo: The patient is from: Home              Anticipated d/c is to: Home              Anticipated d/c date is: Later on this evening or tomorrow.              Patient currently being transitioned from IV antibiotics to oral antibiotics.  Diet being advanced to a soft diet.  If tolerates soft diet with continued improvement could possibly be discharged home this evening.       Consultants:   General surgery: Dr. Rosendo Gros 03/14/2020  Procedures:   CT abdomen  03/14/2020  Antimicrobials:   IV Flagyl 03/14/2020>>>> 03/17/2020  IV Merrem 03/14/2020>>>> 03/17/2020  Oral ciprofloxacin 03/17/2020  Oral Flagyl 03/17/2020   Subjective: Patient sitting up in bed.  Denies chest pain.  No shortness of breath.  States significant improvement with abdominal pain.  No nausea or vomiting.  Tolerating full liquid diet.  Stated had a bowel movement with the first one felt hard however subsequent bowel movement was softer.  Would like to go home today if possible.  Overall feeling significantly better.   Objective: Vitals:   03/16/20 0633 03/16/20 1314  03/16/20 2131 03/17/20 0525  BP: 105/66 119/78 122/78 123/73  Pulse: 72 65 88 (!) 50  Resp: 18  18 17   Temp: 98.2 F (36.8 C) 98.7 F (37.1 C) 98 F (36.7 C) 98.5 F (36.9 C)  TempSrc: Oral Oral Oral Oral  SpO2: 99%  100% 99%  Weight:      Height:        Intake/Output Summary (Last 24 hours) at 03/17/2020 0941 Last data filed at 03/16/2020 2352 Gross per 24 hour  Intake 1524.55 ml  Output --  Net 1524.55 ml   Filed Weights   03/14/20 1543  Weight: 86.2 kg     Examination:  General exam: NAD Respiratory system: Lungs clear to auscultation bilaterally.  No wheezes, no crackles, no rhonchi.  Normal respiratory effort.  Cardiovascular system: RRR no murmurs rubs or gallops.  No JVD.  No lower extremity edema.  Gastrointestinal system: Abdomen is soft, nondistended, significantly decreased tenderness to palpation in the left lower quadrant, positive bowel sounds.  No rebound.  No guarding.   Central nervous system: Alert and oriented. No focal neurological deficits. Extremities: Symmetric 5 x 5 power. Skin: No rashes, lesions or ulcers Psychiatry: Judgement and insight appear normal. Mood & affect appropriate.     Data Reviewed: I have personally reviewed following labs and imaging studies  CBC: Recent Labs  Lab 03/14/20 1602 03/15/20 0601 03/16/20 0511 03/17/20 0437  WBC 11.6* 9.7 7.0 5.3  NEUTROABS 10.3*  --   --   --   HGB 15.2 13.8 13.7 14.4  HCT 47.8 42.1 43.0 45.7  MCV 95.8 96.8 96.4 98.5  PLT 329 266 272 850    Basic Metabolic Panel: Recent Labs  Lab 03/14/20 1602 03/15/20 0601 03/16/20 0511 03/17/20 0437  NA 138 139 136 139  K 4.0 4.2 4.2 4.0  CL 101 105 103 105  CO2 25 24 26 25   GLUCOSE 115* 99 103* 97  BUN 16 16 12 13   CREATININE 1.04 1.01 1.08 0.97  CALCIUM 8.7* 8.6* 8.6* 8.9  MG  --   --  1.9  --     GFR: Estimated Creatinine Clearance: 90.4 mL/min (by C-G formula based on SCr of 0.97 mg/dL).  Liver Function Tests: Recent Labs  Lab 03/14/20 1602  AST 28  ALT 50*  ALKPHOS 60  BILITOT 0.7  PROT 7.7  ALBUMIN 4.0    CBG: No results for input(s): GLUCAP in the last 168 hours.   Recent Results (from the past 240 hour(s))  Blood culture (routine x 2)     Status: None (Preliminary result)   Collection Time: 03/14/20  3:55 PM   Specimen: BLOOD  Result Value Ref Range Status   Specimen Description   Final    BLOOD BLOOD RIGHT ARM Performed at [facility], [address] [place], [place] [address]    Special Requests   Final    BOTTLES DRAWN AEROBIC ONLY Blood Culture results may not be optimal due to an inadequate volume of blood received in culture bottles Performed at [facility], [address] [place], [place] [address]    Culture   Final    NO GROWTH 2 DAYS Performed at [facility], [place] [address] [place], [place] [address]    Report Status PENDING  Incomplete  Blood culture (routine x 2)     Status: None (Preliminary result)   Collection Time: 03/14/20  4:05 PM   Specimen: BLOOD  Result Value Ref Range Status   Specimen Description   Final    BLOOD RIGHT ANTECUBITAL Performed at [facility], [address] [place], [place] [address]    Special Requests   Final    BOTTLES DRAWN AEROBIC AND ANAEROBIC Blood Culture adequate volume Performed at [facility], [address] [place], [place] [address]    Culture   Final    NO GROWTH 2 DAYS Performed at [facility], [place] [address] [place], [place] [address]    Report Status PENDING  Incomplete  SARS Coronavirus 2 by RT PCR (hospital order, performed in [facility] hospital lab) Nasopharyngeal Nasopharyngeal Swab     Status: None   Collection Time: 03/14/20  5:44 PM   Specimen: Nasopharyngeal Swab  Result Value Ref Range Status   SARS Coronavirus 2 NEGATIVE NEGATIVE Final    Comment: (NOTE) SARS-CoV-2 target nucleic acids are NOT DETECTED.  The SARS-CoV-2 RNA is generally detectable in upper and lower respiratory specimens during the acute phase of infection. The lowest concentration of SARS-CoV-2 viral copies this assay can detect is 250 copies / mL. A negative result does not preclude SARS-CoV-2 infection and should not be used as the sole basis for treatment or other patient management decisions.  A negative result may occur with improper specimen collection / handling, submission of specimen other than nasopharyngeal swab, presence of viral  mutation(s) within the areas targeted by this assay, and inadequate number of viral copies (<250 copies / mL). A negative result must be combined with clinical observations, patient history, and epidemiological information.  Fact Sheet for Patients:   StrictlyIdeas.no  Fact Sheet for Healthcare Providers: BankingDealers.co.za  This test is not yet approved or  cleared by the [place] FDA and has been authorized for detection and/or diagnosis of SARS-CoV-2 by FDA under an Emergency Use Authorization (EUA).  This EUA will remain in effect (meaning this test can be used) for the duration of the COVID-19 declaration under Section 564(b)(1) of the Act, 21 U.S.C. section 360bbb-3(b)(1), unless the authorization is terminated or revoked sooner.  Performed at [facility], [address], [place],  [address]          Radiology Studies: No results found.      Scheduled Meds: . cetirizine  10 mg Oral QHS  . ciprofloxacin  500 mg Oral BID  . enoxaparin (LOVENOX) injection  40 mg Subcutaneous Q24H  . oxymetazoline  1 spray Each Nare BID  . pantoprazole  40 mg Oral Daily  . saccharomyces boulardii  250 mg Oral BID  . senna-docusate  1 tablet Oral BID   Continuous Infusions: . sodium chloride 500 mL (03/16/20 0228)  . metronidazole       LOS: 3 days    Time spent: 35 minutes    Irine Seal, MD [facility]   To contact the attending provider between 7A-7P or the covering provider during after hours 7P-7A, please log into the web site [email or web address] and access using universal [place] password for that web site. If you do not have the password, please call the hospital operator.  03/17/2020, 9:41 AM

## 2020-03-17 NOTE — Discharge Instructions (Signed)
Diverticulitis  Diverticulitis is when small pockets in your large intestine (colon) get infected or swollen. This causes stomach pain and watery poop (diarrhea). These pouches are called diverticula. They form in people who have a condition called diverticulosis. Follow these instructions at home: Medicines  Take over-the-counter and prescription medicines only as told by your doctor. These include: ? Antibiotics. ? Pain medicines. ? Fiber pills. ? Probiotics. ? Stool softeners.  Do not drive or use heavy machinery while taking prescription pain medicine.  If you were prescribed an antibiotic, take it as told. Do not stop taking it even if you feel better. General instructions   Follow a diet as told by your doctor.  When you feel better, your doctor may tell you to change your diet. You may need to eat a lot of fiber. Fiber makes it easier to poop (have bowel movements). Healthy foods with fiber include: ? Berries. ? Beans. ? Lentils. ? Green vegetables.  Exercise 3 or more times a week. Aim for 30 minutes each time. Exercise enough to sweat and make your heart beat faster.  Keep all follow-up visits as told. This is important. You may need to have an exam of the large intestine. This is called a colonoscopy. Contact a doctor if:  Your pain does not get better.  You have a hard time eating or drinking.  You are not pooping like normal. Get help right away if:  Your pain gets worse.  Your problems do not get better.  Your problems get worse very fast.  You have a fever.  You throw up (vomit) more than one time.  You have poop that is: ? Bloody. ? Black. ? Tarry. Summary  Diverticulitis is when small pockets in your large intestine (colon) get infected or swollen.  Take medicines only as told by your doctor.  Follow a diet as told by your doctor. This information is not intended to replace advice given to you by your health care provider. Make sure you  discuss any questions you have with your health care provider. Document Revised: 07/11/2017 Document Reviewed: 08/15/2016 Elsevier Patient Education  [place]. Maintain a soft diet for the next 2 weeks while you get over your diverticulitis, then you can use this to assist with constipation.   Irregular bowel habits such as constipation and diarrhea can lead to many problems over time.  Having one soft bowel movement a day is the most important way to prevent further problems.  The anorectal canal is designed to handle stretching and feces to safely manage our ability to get rid of solid waste (feces, poop, stool) out of our body.  BUT, hard constipated stools can act like ripping concrete bricks and diarrhea can be a burning fire to this very sensitive area of our body, causing inflamed hemorrhoids, anal fissures, increasing risk is perirectal abscesses, abdominal pain/bloating, an making irritable bowel worse.     The goal: ONE SOFT BOWEL MOVEMENT A DAY!  To have soft, regular bowel movements:  . Drink at least 8 tall glasses of water a day.   . Take plenty of fiber.  Fiber is the undigested part of plant food that passes into the colon, acting s "natures broom" to encourage bowel motility and movement.  Fiber can absorb and hold large amounts of water. This results in a larger, bulkier stool, which is soft and easier to pass. Work gradually over several weeks up to 6 servings  a day of fiber (25g a day even more if needed) in the form of: o Vegetables -- Root (potatoes, carrots, turnips), leafy green (lettuce, salad greens, celery, spinach), or cooked high residue (cabbage, broccoli, etc) o Fruit -- Fresh (unpeeled skin & pulp), Dried (prunes, apricots, cherries, etc ),  or stewed ( applesauce)  o Whole grain breads, pasta, etc (whole wheat)  o Bran cereals  . Bulking Agents -- This type of water-retaining fiber generally is easily obtained each day by one  of the following:  o Psyllium bran -- The psyllium plant is remarkable because its ground seeds can retain so much water. This product is available as Metamucil, Konsyl, Effersyllium, Per Diem Fiber, or the less expensive generic preparation in drug and health food stores. Although labeled a laxative, it really is not a laxative.  o Methylcellulose -- This is another fiber derived from wood which also retains water. It is available as Citrucel. o Polyethylene Glycol - and "artificial" fiber commonly called Miralax or Glycolax.  It is helpful for people with gassy or bloated feelings with regular fiber o Flax Seed - a less gassy fiber than psyllium . No reading or other relaxing activity while on the toilet. If bowel movements take longer than 5 minutes, you are too constipated . AVOID CONSTIPATION.  High fiber and water intake usually takes care of this.  Sometimes a laxative is needed to stimulate more frequent bowel movements, but  . Laxatives are not a good long-term solution as it can wear the colon out. o Osmotics (Milk of Magnesia, Fleets phosphosoda, Magnesium citrate, MiraLax, GoLytely) are safer than  o Stimulants (Senokot, Castor Oil, Dulcolax, Ex Lax)    o Do not take laxatives for more than 7days in a row. .  IF SEVERELY CONSTIPATED, try a Bowel Retraining Program: o Do not use laxatives.  o Eat a diet high in roughage, such as bran cereals and leafy vegetables.  o Drink six (6) ounces of prune or apricot juice each morning.  o Eat two (2) large servings of stewed fruit each day.  o Take one (1) heaping tablespoon of a psyllium-based bulking agent twice a day. Use sugar-free sweetener when possible to avoid excessive calories.  o Eat a normal breakfast.  o Set aside 15 minutes after breakfast to sit on the toilet, but do not strain to have a bowel movement.  o If you do not have a bowel movement by the third day, use an enema and repeat the above steps.  . Controlling  diarrhea o Switch to liquids and simpler foods for a few days to avoid stressing your intestines further. o Avoid dairy products (especially milk & ice cream) for a short time.  The intestines often can lose the ability to digest lactose when stressed. o Avoid foods that cause gassiness or bloating.  Typical foods include beans and other legumes, cabbage, broccoli, and dairy foods.  Every person has some sensitivity to other foods, so listen to our body and avoid those foods that trigger problems for you. o Adding fiber (Citrucel, Metamucil, psyllium, Miralax) gradually can help thicken stools by absorbing excess fluid and retrain the intestines to act more normally.  Slowly increase the dose over a few weeks.  Too much fiber too soon can backfire and cause cramping & bloating. o Probiotics (such as active yogurt, Align, etc) may help repopulate the intestines and colon with normal bacteria and calm down a sensitive digestive tract.  Most studies show it to  be of mild help, though, and such products can be costly. o Medicines: - Bismuth subsalicylate (ex. Kayopectate, Pepto Bismol) every 30 minutes for up to 6 doses can help control diarrhea.  Avoid if pregnant. - Loperamide (Immodium) can slow down diarrhea.  Start with two tablets (4mg  total) first and then try one tablet every 6 hours.  Avoid if you are having fevers or severe pain.  If you are not better or start feeling worse, stop all medicines and call your doctor for advice o Call your doctor if you are getting worse or not better.  Sometimes further testing (cultures, endoscopy, X-ray studies, bloodwork, etc) may be needed to help diagnose and treat the cause of the diarrhea.

## 2020-03-17 NOTE — Progress Notes (Signed)
Discharge instructions given to patient and all questions were answered.  

## 2020-03-17 NOTE — Discharge Summary (Signed)
Physician Discharge Summary  Patrick [name] IWP:[id number] DOB: May 07, 1956 DOA: 03/14/2020  PCP: Shon Baton, MD  Admit date: 03/14/2020 Discharge date: 03/17/2020  Time spent: 50 minutes  Recommendations for Outpatient Follow-up:  1. Follow-up with Shon Baton, MD in 1 to 2 weeks.  On follow-up patient will need a basic metabolic profile done to follow-up on electrolytes and renal function.  Patient will need referral to gastroenterology for follow-up on diverticulitis and will need to be scheduled for colonoscopy in 6 to 8 weeks. 2. Follow-up with Dr. Marlou Starks III, general surgery.   Discharge Diagnoses:  Principal Problem:   Diverticulitis of colon with perforation Active Problems:   Diverticulitis   Gastroesophageal reflux disease   Perforation of sigmoid colon due to diverticulitis   Discharge Condition: Stable and improved  Diet recommendation: Heart healthy  Filed Weights   03/14/20 1543  Weight: 86.2 kg    History of present illness:  HPI per Dr. Kallie Edward is a 64 y.o. male with no significant past medical history presents to the ER with complaint of worsening abdominal pain.  Patient had been having abdominal pain for the last 3 days mostly in the lower quadrant constant with no associated food.  Denied any diarrhea or nausea or vomiting.  Subjective feeling of fever chills.  Patient had gone to urgent care center 2 days ago and was prescribed antibiotics despite taking which patient's pain worsened.  ED Course: In the ER patient was febrile with temperature 103 F and CT scan of the abdomen pelvis shows features consistent with sigmoid diverticulitis with microperforation.  On-call general surgery was consulted patient started on meropenem.  Admitted for further management.  Labs are significant for WBC count of 11.6 Covid test negative.  Hospital Course:  1 complicated acute diverticulitis with microperforation Patient presented to the ED with 3-day history of  worsening lower quadrant abdominal pain, subjective fever and chills, noted to have a temperature of 103 F in the ED CT abdomen with features consistent with sigmoid diverticulitis with microperforation.  Patient seen in consultation by general surgery .  Patient placed empirically on IV  Flagyl and IV Merrem, bowel rest, IV fluids, pain management, antiemetics, supportive care.  Patient was monitored during the hospitalization.  General surgery follow the patient.  Patient improved clinically and remained afebrile.  Patient subsequently started on a clear liquid diet which he tolerated and diet advanced to a full liquid and subsequently a soft diet.  Abdominal pain had improved significantly by day of discharge.  Patient was ambulating in the hallways.  Patient was passing flatus.  Patient had bowel movement.  Patient subsequently transition to oral ciprofloxacin and Flagyl.  Patient was discharged home in stable and improved condition on a 2-week course of oral ciprofloxacin and Flagyl.  Outpatient follow-up with PCP and general surgery.  Patient will need referral to gastroenterology in the outpatient setting for colonoscopy in 6 to 8 weeks.  Patient will be discharged in stable and improved condition.   2.  Gastroesophageal reflux disease Patient maintained on a PPI.  Outpatient follow-up.    Procedures:  CT abdomen  03/14/2020  Consultations:  General surgery: Dr. Rosendo Gros 03/14/2020   Discharge Exam: Vitals:   03/17/20 0525 03/17/20 1329  BP: 123/73 114/76  Pulse: (!) 50 62  Resp: 17 16  Temp: 98.5 F (36.9 C) 98 F (36.7 C)  SpO2: 99% 94%    General: NAD Cardiovascular: RRR Respiratory: CTAB  Discharge Instructions   Discharge Instructions  Diet - low sodium heart healthy   Complete by: As directed    Increase activity slowly   Complete by: As directed      Allergies as of 03/17/2020      Reactions   Penicillins Anaphylaxis   Did it involve swelling of the  face/tongue/throat, SOB, or low BP? Yes Did it involve sudden or severe rash/hives, skin peeling, or any reaction on the inside of your mouth or nose? No Did you need to seek medical attention at a hospital or doctor's office? Yes When did it last happen?Childhood allergy If all above answers are "NO", may proceed with cephalosporin use.   Rifampin Other (See Comments)   He had severe headaches gastroesophageal reflux disease and malaise      Medication List    STOP taking these medications   doxycycline 100 MG tablet Commonly known as: VIBRA-TABS     TAKE these medications   B-D 3CC LUER-LOK SYR 20GX1" 20G X 1" 3 ML Misc Generic drug: SYRINGE-NEEDLE (DISP) 3 ML USE TO INJECT ONCE WEEKLY   BD Disp Needle 23G X 1" Misc Generic drug: NEEDLE (DISP) 23 G INJECT ONCE A WEEK   ciprofloxacin 500 MG tablet Commonly known as: CIPRO Take 1 tablet (500 mg total) by mouth 2 (two) times daily for 14 days.   metroNIDAZOLE 500 MG tablet Commonly known as: FLAGYL Take 1 tablet (500 mg total) by mouth every 8 (eight) hours for 14 days.   omeprazole 20 MG capsule Commonly known as: PRILOSEC Take 20 mg by mouth daily.   oxymetazoline 0.05 % nasal spray Commonly known as: AFRIN Place 1 spray into both nostrils 2 (two) times daily.   polyethylene glycol 17 g packet Commonly known as: MIRALAX / GLYCOLAX Take 17 g by mouth daily as needed for mild constipation.   saccharomyces boulardii 250 MG capsule Commonly known as: FLORASTOR Take 1 capsule (250 mg total) by mouth 2 (two) times daily for 14 days.   testosterone cypionate 200 MG/ML injection Commonly known as: DEPOTESTOSTERONE CYPIONATE Inject 200 mg into the muscle every 14 (fourteen) days.   traMADol 50 MG tablet Commonly known as: Ultram Take 2 tablets (100 mg total) by mouth every 6 (six) hours as needed.   ZYRTEC PO Take 10 mg by mouth at bedtime.      Allergies  Allergen Reactions  . Penicillins Anaphylaxis     Did it involve swelling of the face/tongue/throat, SOB, or low BP? Yes Did it involve sudden or severe rash/hives, skin peeling, or any reaction on the inside of your mouth or nose? No Did you need to seek medical attention at a hospital or doctor's office? Yes When did it last happen?Childhood allergy If all above answers are "NO", may proceed with cephalosporin use.   . Rifampin Other (See Comments)    He had severe headaches gastroesophageal reflux disease and malaise    Follow-up Information    Shon Baton, MD. Schedule an appointment as soon as possible for a visit in 2 week(s).   Specialty: Internal Medicine Why: call Dr. Virgina Jock for follow up in 1-2 weeks, let him arrange follow up colonoscopy in 6-8 weeks. Contact information: [place] [address] [phone]        Jovita Kussmaul, MD Follow up.   Specialty: General Surgery Why: If you need a referral for possible colon resection after your colonoscopy, call and ask for an appointment. Contact information: [address] [place] [address]  [phone]                The results of significant diagnostics from this hospitalization (including imaging, microbiology, ancillary and laboratory) are listed below for reference.    Significant Diagnostic Studies: CT ABDOMEN W CONTRAST  Result Date: 03/14/2020 CLINICAL DATA:  Left lower quadrant abdominal pain for 3 days. EXAM: CT ABDOMEN WITH CONTRAST TECHNIQUE: Multidetector CT imaging of the abdomen was performed using the standard protocol following bolus administration of intravenous contrast. CONTRAST:  143mL OMNIPAQUE IOHEXOL 300 MG/ML  SOLN COMPARISON:  06/22/2018 FINDINGS: Lower chest: The lung bases are clear of acute process. No pleural effusion or pulmonary lesions. The heart is normal in size. No pericardial effusion. The distal esophagus and aorta are unremarkable. Hepatobiliary: No hepatic lesions or intrahepatic biliary  dilatation. The gallbladder appears normal. No common bile duct dilatation. Pancreas: No mass, inflammation or ductal dilatation. Spleen: Normal size.  No focal lesions. Adrenals/Urinary Tract: The adrenal glands and kidneys are unremarkable. The bladder appears normal. Stomach/Bowel: The stomach, duodenum and small bowel are unremarkable. No acute inflammatory process, mass lesion or obstructive finding. The terminal ileum is normal. The appendix is normal. Diffuse colonic diverticulosis is noted. Focal area of complicated diverticulitis involving the upper sigmoid colon. Evidence of perforation with small collection of free air anterior to the sigmoid colon. There is also scattered free air in the abdomen anteriorly. No abscess is identified. Vascular/Lymphatic: The aorta is normal in caliber. No atherosclerotic calcifications, aneurysm or dissection. The branch vessels are patent. The major venous structures are patent. Retroaortic left renal vein is noted. Small scattered mesenteric and retroperitoneal lymph nodes, likely reactive. No mass or overt adenopathy. Other: No pelvic mass or adenopathy. No free pelvic fluid collections. No inguinal mass or adenopathy. No abdominal wall hernia or subcutaneous lesions. Prominent left inguinal ring containing fat, likely inguinal hernia. Musculoskeletal: Age advanced degenerative lumbar spondylosis with multilevel disc disease and facet disease. No acute bony findings. IMPRESSION: 1. CT findings consistent with acute complicated diverticulitis involving the upper sigmoid colon with perforation and free air. No abscess. 2. No other significant abdominal findings, mass lesion or adenopathy. 3. Age advanced degenerative lumbar spondylosis with multilevel disc disease and facet disease. Electronically Signed   By: Marijo Sanes M.D.   On: 03/14/2020 17:35    Microbiology: Recent Results (from the past 240 hour(s))  Blood culture (routine x 2)     Status: None  (Preliminary result)   Collection Time: 03/14/20  3:55 PM   Specimen: BLOOD  Result Value Ref Range Status   Specimen Description   Final    BLOOD BLOOD RIGHT ARM Performed at [facility], [address] [place], [place] [address]    Special Requests   Final    BOTTLES DRAWN AEROBIC ONLY Blood Culture results may not be optimal due to an inadequate volume of blood received in culture bottles Performed at [facility], [address] [place], [place] [address]    Culture   Final    NO GROWTH 3 DAYS Performed at [facility], [place] [address] [place], [place] [address]    Report Status PENDING  Incomplete  Blood culture (routine x 2)     Status: None (Preliminary result)   Collection Time: 03/14/20  4:05 PM   Specimen: BLOOD  Result Value Ref Range Status   Specimen Description   Final    BLOOD RIGHT ANTECUBITAL Performed at [facility],  [place], [place] [address]    Special Requests   Final    BOTTLES DRAWN AEROBIC AND ANAEROBIC Blood Culture adequate volume Performed at [facility], [address] [place], [place] [address]    Culture   Final    NO GROWTH 3 DAYS Performed at [facility], [place] [address], [place], [place] [address]    Report Status PENDING  Incomplete  SARS Coronavirus 2 by RT PCR (hospital order, performed in [facility] hospital lab) Nasopharyngeal Nasopharyngeal Swab     Status: None   Collection Time: 03/14/20  5:44 PM   Specimen: Nasopharyngeal Swab  Result Value Ref Range Status   SARS Coronavirus 2 NEGATIVE NEGATIVE Final    Comment: (NOTE) SARS-CoV-2 target nucleic acids are NOT DETECTED.  The SARS-CoV-2 RNA is generally detectable in upper and lower respiratory specimens during the acute phase of infection. The lowest concentration of SARS-CoV-2 viral copies this assay can detect is 250 copies / mL. A negative result does not preclude SARS-CoV-2 infection and should not  be used as the sole basis for treatment or other patient management decisions.  A negative result may occur with improper specimen collection / handling, submission of specimen other than nasopharyngeal swab, presence of viral mutation(s) within the areas targeted by this assay, and inadequate number of viral copies (<250 copies / mL). A negative result must be combined with clinical observations, patient history, and epidemiological information.  Fact Sheet for Patients:   StrictlyIdeas.no  Fact Sheet for Healthcare Providers: BankingDealers.co.za  This test is not yet approved or  cleared by the [place] FDA and has been authorized for detection and/or diagnosis of SARS-CoV-2 by FDA under an Emergency Use Authorization (EUA).  This EUA will remain in effect (meaning this test can be used) for the duration of the COVID-19 declaration under Section 564(b)(1) of the Act, 21 U.S.C. section 360bbb-3(b)(1), unless the authorization is terminated or revoked sooner.  Performed at [facility], [address] [place], [place] [address]      Labs: Basic Metabolic Panel: Recent Labs  Lab 03/14/20 1602 03/15/20 0601 03/16/20 0511 03/17/20 0437  NA 138 139 136 139  K 4.0 4.2 4.2 4.0  CL 101 105 103 105  CO2 25 24 26 25   GLUCOSE 115* 99 103* 97  BUN 16 16 12 13   CREATININE 1.04 1.01 1.08 0.97  CALCIUM 8.7* 8.6* 8.6* 8.9  MG  --   --  1.9  --    Liver Function Tests: Recent Labs  Lab 03/14/20 1602  AST 28  ALT 50*  ALKPHOS 60  BILITOT 0.7  PROT 7.7  ALBUMIN 4.0   Recent Labs  Lab 03/14/20 1602  LIPASE 29   No results for input(s): AMMONIA in the last 168 hours. CBC: Recent Labs  Lab 03/14/20 1602 03/15/20 0601 03/16/20 0511 03/17/20 0437  WBC 11.6* 9.7 7.0 5.3  NEUTROABS 10.3*  --   --   --   HGB 15.2 13.8 13.7 14.4  HCT 47.8 42.1 43.0 45.7  MCV 95.8 96.8 96.4 98.5  PLT 329 266 272 288    Cardiac Enzymes: No results for input(s): CKTOTAL, CKMB, CKMBINDEX, TROPONINI in the last 168 hours. BNP: BNP (last 3 results) No results for input(s): BNP in the last 8760 hours.  ProBNP (last 3 results) No results for input(s): PROBNP in the last 8760 hours.  CBG: No results for input(s): GLUCAP in the  last 168 hours.     Signed:  Irine Seal MD.  [facility] 03/17/2020, 3:22 PM

## 2020-03-19 LAB — CULTURE, BLOOD (ROUTINE X 2)
Culture: NO GROWTH
Culture: NO GROWTH
Special Requests: ADEQUATE

## 2020-03-27 DIAGNOSIS — K219 Gastro-esophageal reflux disease without esophagitis: Secondary | ICD-10-CM | POA: Diagnosis not present

## 2020-04-03 ENCOUNTER — Telehealth: Payer: Self-pay | Admitting: Internal Medicine

## 2020-04-03 NOTE — Telephone Encounter (Signed)
Please see note below and advise. Pt is asking to be a direct but he has had issues and I think you will want an OV. Please advise.

## 2020-04-03 NOTE — Telephone Encounter (Signed)
Patient being referred for a colonoscopy. He called in to schedule. Not due for colonoscopy until 2025. Patient states that hes doctor wants him to have colonoscopy because he was in the hospital for diverticulitis, stated that it was perforated. States that he got put on antibotics and need to see if it healed it. He is asking if theres any way possible for him not to have to come to an OV if he can help it because he has to pay a lot out of pocket due to [employer]. Told him I would send a message to the nurse.

## 2020-04-03 NOTE — Telephone Encounter (Signed)
Not sure that he needs a colonoscopy (just had one 01-2019). He does need an OV. Thanks

## 2020-04-04 NOTE — Telephone Encounter (Signed)
See note below from Dr. Henrene Pastor, pt needs an OV.

## 2020-04-05 NOTE — Telephone Encounter (Signed)
Left message to call back and schedule OV with Dr. Henrene Pastor.

## 2020-04-06 NOTE — Telephone Encounter (Signed)
Feels that he should be able to schedule Colonoscopy directly because his surgeon Dr. Marlou Starks put on his discharge instructions that he needs one in 6-8 weeks from discharge date on 03-14-20. Tells me his specialist copay is $120 and he feels that he already knows what will happen at that office visit. Will have Dr Marlou Starks send his notes for Dr Henrene Pastor to review in the hopes that he can just be scheduled directly for the Colonoscopy.

## 2020-04-21 NOTE — Telephone Encounter (Signed)
Explained to pt that Dr. Henrene Pastor is not sure he needs another colonoscopy done. Pt scheduled for OV with Dr. Henrene Pastor 06/05/20@8 :20am. Pt aware of appt.

## 2020-05-15 DIAGNOSIS — E291 Testicular hypofunction: Secondary | ICD-10-CM | POA: Diagnosis not present

## 2020-05-15 DIAGNOSIS — E349 Endocrine disorder, unspecified: Secondary | ICD-10-CM | POA: Diagnosis not present

## 2020-05-15 DIAGNOSIS — K219 Gastro-esophageal reflux disease without esophagitis: Secondary | ICD-10-CM | POA: Diagnosis not present

## 2020-05-15 DIAGNOSIS — R1031 Right lower quadrant pain: Secondary | ICD-10-CM | POA: Diagnosis not present

## 2020-06-05 ENCOUNTER — Encounter: Payer: Self-pay | Admitting: Internal Medicine

## 2020-06-05 ENCOUNTER — Ambulatory Visit: Payer: BC Managed Care – PPO | Admitting: Internal Medicine

## 2020-06-05 VITALS — BP 118/70 | HR 74 | Ht 73.0 in | Wt 185.0 lb

## 2020-06-05 DIAGNOSIS — R935 Abnormal findings on diagnostic imaging of other abdominal regions, including retroperitoneum: Secondary | ICD-10-CM | POA: Diagnosis not present

## 2020-06-05 DIAGNOSIS — K5732 Diverticulitis of large intestine without perforation or abscess without bleeding: Secondary | ICD-10-CM | POA: Diagnosis not present

## 2020-06-05 DIAGNOSIS — Z86010 Personal history of colonic polyps: Secondary | ICD-10-CM | POA: Diagnosis not present
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
## 2020-06-05 NOTE — Patient Instructions (Signed)
Please follow up as needed 

## 2020-06-12 ENCOUNTER — Ambulatory Visit: Payer: Self-pay | Admitting: Podiatry

## 2020-06-13 ENCOUNTER — Ambulatory Visit (INDEPENDENT_AMBULATORY_CARE_PROVIDER_SITE_OTHER): Payer: BC Managed Care – PPO

## 2020-06-13 ENCOUNTER — Ambulatory Visit: Payer: BC Managed Care – PPO | Admitting: Podiatry

## 2020-06-13 ENCOUNTER — Other Ambulatory Visit: Payer: Self-pay | Admitting: Podiatry

## 2020-06-13 ENCOUNTER — Encounter: Payer: Self-pay | Admitting: Podiatry

## 2020-06-13 ENCOUNTER — Other Ambulatory Visit: Payer: Self-pay

## 2020-06-13 DIAGNOSIS — L97521 Non-pressure chronic ulcer of other part of left foot limited to breakdown of skin: Secondary | ICD-10-CM

## 2020-06-13 DIAGNOSIS — L02612 Cutaneous abscess of left foot: Secondary | ICD-10-CM

## 2020-06-13 DIAGNOSIS — B351 Tinea unguium: Secondary | ICD-10-CM

## 2020-06-13 DIAGNOSIS — L03032 Cellulitis of left toe: Secondary | ICD-10-CM

## 2020-06-13 MED ORDER — DOXYCYCLINE HYCLATE 100 MG PO TABS
100.0000 mg | ORAL_TABLET | Freq: Two times a day (BID) | ORAL | 0 refills | Status: DC
Start: 2020-06-13 — End: 2023-11-18
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
## 2020-06-17 LAB — WOUND CULTURE
MICRO NUMBER:: 11149875
SPECIMEN QUALITY:: ADEQUATE

## 2020-06-17 LAB — HOUSE ACCOUNT TRACKING

## 2020-06-19 ENCOUNTER — Other Ambulatory Visit: Payer: Self-pay | Admitting: Podiatry

## 2020-06-19 MED ORDER — CIPROFLOXACIN HCL 500 MG PO TABS: 500.0000 mg | ORAL_TABLET | Freq: Two times a day (BID) | ORAL | 0 refills | Status: DC

## 2020-07-04 ENCOUNTER — Ambulatory Visit: Payer: BC Managed Care – PPO | Admitting: Podiatry

## 2020-07-04 ENCOUNTER — Other Ambulatory Visit: Payer: Self-pay

## 2020-07-04 DIAGNOSIS — L988 Other specified disorders of the skin and subcutaneous tissue: Secondary | ICD-10-CM

## 2020-07-04 DIAGNOSIS — L03032 Cellulitis of left toe: Secondary | ICD-10-CM

## 2020-07-04 DIAGNOSIS — L97521 Non-pressure chronic ulcer of other part of left foot limited to breakdown of skin: Secondary | ICD-10-CM | POA: Diagnosis not present

## 2020-07-04 DIAGNOSIS — L02612 Cutaneous abscess of left foot: Secondary | ICD-10-CM

## 2020-07-04 MED ORDER — EFINACONAZOLE 10 % EX SOLN: 1.0000 [drp] | Freq: Every day | CUTANEOUS | 11 refills | Status: DC

## 2020-07-07 DIAGNOSIS — Z20822 Contact with and (suspected) exposure to covid-19: Secondary | ICD-10-CM | POA: Diagnosis not present

## 2020-07-10 NOTE — Progress Notes (Signed)
Subjective: 64 year old male presents the office today for follow evaluation of infection to his left fourth interspace.  He states that it is doing much better.  Is not had any drainage or pus coming from the area.  Still somewhat red around where the pus had come out but that no drainage no red streaks or swelling.  No pain.  He did break a rib over the weekend but no other lower extremity concerns. Denies any systemic complaints such as fevers, chills, nausea, vomiting. No acute changes since last appointment, and no other complaints at this time.   Objective: AAO x3, NAD DP/PT pulses palpable bilaterally, CRT less than 3 seconds On the fourth interspace of the left foot there is one central area of hyperkeratotic tissue from where there was a abscess present.  Upon debridement there is no drainage or pus identified to the area today there is no fluctuation or crepitation.  There is no malodor.  There is no ascending cellulitis.  No pain.  Macerated tissue presently interspace.  No pain with calf compression, swelling, warmth, erythema  Assessment: Resolving infection left fourth interspace  Plan: -All treatment options discussed with the patient including all alternatives, risks, complications.  -At this time there is no drainage or pus.  Infection appears to be almost completely resolved.  However concerned that the macerated tissue and reoccurrence.  Discussed with him Betadine interdigitally.  Also dry thoroughly between the toes.  Discussed lambswool or cotton to help absorb moisture. -Patient encouraged to call the office with any questions, concerns, change in symptoms.   Trula Slade DPM

## 2020-07-26 DIAGNOSIS — D225 Melanocytic nevi of trunk: Secondary | ICD-10-CM | POA: Diagnosis not present

## 2020-07-26 DIAGNOSIS — L821 Other seborrheic keratosis: Secondary | ICD-10-CM | POA: Diagnosis not present

## 2020-07-26 DIAGNOSIS — L738 Other specified follicular disorders: Secondary | ICD-10-CM | POA: Diagnosis not present

## 2020-07-26 DIAGNOSIS — Z85828 Personal history of other malignant neoplasm of skin: Secondary | ICD-10-CM | POA: Diagnosis not present

## 2020-12-20 DIAGNOSIS — J309 Allergic rhinitis, unspecified: Secondary | ICD-10-CM | POA: Diagnosis not present

## 2020-12-20 DIAGNOSIS — J0100 Acute maxillary sinusitis, unspecified: Secondary | ICD-10-CM | POA: Diagnosis not present

## 2021-02-06 DIAGNOSIS — Z85828 Personal history of other malignant neoplasm of skin: Secondary | ICD-10-CM | POA: Diagnosis not present

## 2021-02-06 DIAGNOSIS — D225 Melanocytic nevi of trunk: Secondary | ICD-10-CM | POA: Diagnosis not present

## 2021-02-06 DIAGNOSIS — L905 Scar conditions and fibrosis of skin: Secondary | ICD-10-CM | POA: Diagnosis not present

## 2021-02-06 DIAGNOSIS — L820 Inflamed seborrheic keratosis: Secondary | ICD-10-CM | POA: Diagnosis not present

## 2021-02-06 DIAGNOSIS — C44619 Basal cell carcinoma of skin of left upper limb, including shoulder: Secondary | ICD-10-CM | POA: Diagnosis not present

## 2021-02-06 DIAGNOSIS — L738 Other specified follicular disorders: Secondary | ICD-10-CM | POA: Diagnosis not present

## 2021-02-19 DIAGNOSIS — H1045 Other chronic allergic conjunctivitis: Secondary | ICD-10-CM | POA: Diagnosis not present

## 2021-02-19 DIAGNOSIS — H0288B Meibomian gland dysfunction left eye, upper and lower eyelids: Secondary | ICD-10-CM | POA: Diagnosis not present

## 2021-02-19 DIAGNOSIS — H2513 Age-related nuclear cataract, bilateral: Secondary | ICD-10-CM | POA: Diagnosis not present

## 2021-02-19 DIAGNOSIS — H0288A Meibomian gland dysfunction right eye, upper and lower eyelids: Secondary | ICD-10-CM | POA: Diagnosis not present

## 2021-05-21 DIAGNOSIS — E291 Testicular hypofunction: Secondary | ICD-10-CM | POA: Diagnosis not present

## 2021-06-19 DIAGNOSIS — L738 Other specified follicular disorders: Secondary | ICD-10-CM | POA: Diagnosis not present

## 2021-06-19 DIAGNOSIS — L820 Inflamed seborrheic keratosis: Secondary | ICD-10-CM | POA: Diagnosis not present

## 2021-06-19 DIAGNOSIS — L821 Other seborrheic keratosis: Secondary | ICD-10-CM | POA: Diagnosis not present

## 2021-06-19 DIAGNOSIS — C44619 Basal cell carcinoma of skin of left upper limb, including shoulder: Secondary | ICD-10-CM | POA: Diagnosis not present

## 2021-06-19 DIAGNOSIS — L570 Actinic keratosis: Secondary | ICD-10-CM | POA: Diagnosis not present

## 2021-06-19 DIAGNOSIS — Z85828 Personal history of other malignant neoplasm of skin: Secondary | ICD-10-CM | POA: Diagnosis not present

## 2021-06-19 DIAGNOSIS — C44612 Basal cell carcinoma of skin of right upper limb, including shoulder: Secondary | ICD-10-CM | POA: Diagnosis not present

## 2021-06-19 DIAGNOSIS — D225 Melanocytic nevi of trunk: Secondary | ICD-10-CM | POA: Diagnosis not present

## 2021-10-04 DIAGNOSIS — M25552 Pain in left hip: Secondary | ICD-10-CM | POA: Diagnosis not present

## 2021-10-04 DIAGNOSIS — M545 Low back pain, unspecified: Secondary | ICD-10-CM | POA: Diagnosis not present

## 2021-10-18 DIAGNOSIS — E349 Endocrine disorder, unspecified: Secondary | ICD-10-CM | POA: Diagnosis not present

## 2021-10-18 DIAGNOSIS — Z0000 Encounter for general adult medical examination without abnormal findings: Secondary | ICD-10-CM | POA: Diagnosis not present

## 2021-10-18 DIAGNOSIS — E291 Testicular hypofunction: Secondary | ICD-10-CM | POA: Diagnosis not present

## 2021-10-18 DIAGNOSIS — Z125 Encounter for screening for malignant neoplasm of prostate: Secondary | ICD-10-CM | POA: Diagnosis not present

## 2021-10-18 DIAGNOSIS — E785 Hyperlipidemia, unspecified: Secondary | ICD-10-CM | POA: Diagnosis not present

## 2021-10-25 DIAGNOSIS — Z0000 Encounter for general adult medical examination without abnormal findings: Secondary | ICD-10-CM | POA: Diagnosis not present

## 2021-10-25 DIAGNOSIS — R82998 Other abnormal findings in urine: Secondary | ICD-10-CM | POA: Diagnosis not present

## 2021-10-25 DIAGNOSIS — Z1212 Encounter for screening for malignant neoplasm of rectum: Secondary | ICD-10-CM | POA: Diagnosis not present

## 2021-10-25 DIAGNOSIS — Z1331 Encounter for screening for depression: Secondary | ICD-10-CM | POA: Diagnosis not present

## 2021-10-25 DIAGNOSIS — E785 Hyperlipidemia, unspecified: Secondary | ICD-10-CM | POA: Diagnosis not present

## 2021-10-26 ENCOUNTER — Other Ambulatory Visit: Payer: Self-pay | Admitting: Internal Medicine

## 2021-10-26 DIAGNOSIS — E785 Hyperlipidemia, unspecified: Secondary | ICD-10-CM

## 2021-11-27 DIAGNOSIS — M5416 Radiculopathy, lumbar region: Secondary | ICD-10-CM | POA: Diagnosis not present

## 2021-11-27 DIAGNOSIS — M25852 Other specified joint disorders, left hip: Secondary | ICD-10-CM | POA: Diagnosis not present

## 2021-11-27 DIAGNOSIS — M25851 Other specified joint disorders, right hip: Secondary | ICD-10-CM | POA: Diagnosis not present

## 2021-11-27 DIAGNOSIS — M7062 Trochanteric bursitis, left hip: Secondary | ICD-10-CM | POA: Diagnosis not present

## 2021-11-29 DIAGNOSIS — M25852 Other specified joint disorders, left hip: Secondary | ICD-10-CM | POA: Diagnosis not present

## 2021-11-29 DIAGNOSIS — M25851 Other specified joint disorders, right hip: Secondary | ICD-10-CM | POA: Diagnosis not present

## 2021-11-29 DIAGNOSIS — M5416 Radiculopathy, lumbar region: Secondary | ICD-10-CM | POA: Diagnosis not present

## 2021-11-29 DIAGNOSIS — M7062 Trochanteric bursitis, left hip: Secondary | ICD-10-CM | POA: Diagnosis not present

## 2021-12-04 ENCOUNTER — Ambulatory Visit
Admission: RE | Admit: 2021-12-04 | Discharge: 2021-12-04 | Disposition: A | Discharge status: Home / Self Care | Payer: No Typology Code available for payment source | Source: Ambulatory Visit | Attending: Internal Medicine | Admitting: Internal Medicine

## 2021-12-04 DIAGNOSIS — M7062 Trochanteric bursitis, left hip: Secondary | ICD-10-CM | POA: Diagnosis not present

## 2021-12-04 DIAGNOSIS — E785 Hyperlipidemia, unspecified: Secondary | ICD-10-CM

## 2021-12-04 DIAGNOSIS — Z0389 Encounter for observation for other suspected diseases and conditions ruled out: Secondary | ICD-10-CM | POA: Diagnosis not present

## 2021-12-04 DIAGNOSIS — M25851 Other specified joint disorders, right hip: Secondary | ICD-10-CM | POA: Diagnosis not present

## 2021-12-04 DIAGNOSIS — M25852 Other specified joint disorders, left hip: Secondary | ICD-10-CM | POA: Diagnosis not present

## 2021-12-04 DIAGNOSIS — M5416 Radiculopathy, lumbar region: Secondary | ICD-10-CM | POA: Diagnosis not present

## 2021-12-06 DIAGNOSIS — M25852 Other specified joint disorders, left hip: Secondary | ICD-10-CM | POA: Diagnosis not present

## 2021-12-06 DIAGNOSIS — M5416 Radiculopathy, lumbar region: Secondary | ICD-10-CM | POA: Diagnosis not present

## 2021-12-06 DIAGNOSIS — M25851 Other specified joint disorders, right hip: Secondary | ICD-10-CM | POA: Diagnosis not present

## 2021-12-06 DIAGNOSIS — M7062 Trochanteric bursitis, left hip: Secondary | ICD-10-CM | POA: Diagnosis not present

## 2021-12-14 DIAGNOSIS — M25851 Other specified joint disorders, right hip: Secondary | ICD-10-CM | POA: Diagnosis not present

## 2021-12-14 DIAGNOSIS — M5416 Radiculopathy, lumbar region: Secondary | ICD-10-CM | POA: Diagnosis not present

## 2021-12-14 DIAGNOSIS — M25852 Other specified joint disorders, left hip: Secondary | ICD-10-CM | POA: Diagnosis not present

## 2021-12-14 DIAGNOSIS — M7062 Trochanteric bursitis, left hip: Secondary | ICD-10-CM | POA: Diagnosis not present

## 2021-12-25 DIAGNOSIS — L821 Other seborrheic keratosis: Secondary | ICD-10-CM | POA: Diagnosis not present

## 2021-12-25 DIAGNOSIS — L570 Actinic keratosis: Secondary | ICD-10-CM | POA: Diagnosis not present

## 2021-12-25 DIAGNOSIS — D1801 Hemangioma of skin and subcutaneous tissue: Secondary | ICD-10-CM | POA: Diagnosis not present

## 2021-12-25 DIAGNOSIS — D225 Melanocytic nevi of trunk: Secondary | ICD-10-CM | POA: Diagnosis not present

## 2021-12-25 DIAGNOSIS — L820 Inflamed seborrheic keratosis: Secondary | ICD-10-CM | POA: Diagnosis not present

## 2021-12-25 DIAGNOSIS — C44612 Basal cell carcinoma of skin of right upper limb, including shoulder: Secondary | ICD-10-CM | POA: Diagnosis not present

## 2021-12-27 DIAGNOSIS — E291 Testicular hypofunction: Secondary | ICD-10-CM | POA: Diagnosis not present

## 2022-01-01 DIAGNOSIS — M25552 Pain in left hip: Secondary | ICD-10-CM | POA: Diagnosis not present

## 2022-01-01 DIAGNOSIS — M25551 Pain in right hip: Secondary | ICD-10-CM | POA: Diagnosis not present

## 2022-01-22 DIAGNOSIS — M25551 Pain in right hip: Secondary | ICD-10-CM | POA: Diagnosis not present

## 2022-02-28 DIAGNOSIS — M25551 Pain in right hip: Secondary | ICD-10-CM | POA: Diagnosis not present

## 2022-03-04 DIAGNOSIS — M25551 Pain in right hip: Secondary | ICD-10-CM | POA: Diagnosis not present

## 2022-04-29 DIAGNOSIS — M25551 Pain in right hip: Secondary | ICD-10-CM | POA: Diagnosis not present

## 2022-04-29 DIAGNOSIS — Z01812 Encounter for preprocedural laboratory examination: Secondary | ICD-10-CM | POA: Diagnosis not present

## 2022-05-16 DIAGNOSIS — M1611 Unilateral primary osteoarthritis, right hip: Secondary | ICD-10-CM | POA: Diagnosis not present

## 2022-05-21 ENCOUNTER — Other Ambulatory Visit: Payer: Self-pay | Admitting: Orthopedic Surgery

## 2022-05-21 DIAGNOSIS — M7989 Other specified soft tissue disorders: Secondary | ICD-10-CM

## 2022-05-22 ENCOUNTER — Ambulatory Visit
Admission: RE | Admit: 2022-05-22 | Discharge: 2022-05-22 | Disposition: A | Discharge status: Home / Self Care | Payer: BC Managed Care – PPO | Source: Ambulatory Visit | Attending: Orthopedic Surgery | Admitting: Orthopedic Surgery

## 2022-05-22 DIAGNOSIS — M79604 Pain in right leg: Secondary | ICD-10-CM | POA: Diagnosis not present

## 2022-05-22 DIAGNOSIS — M7989 Other specified soft tissue disorders: Secondary | ICD-10-CM | POA: Diagnosis not present

## 2022-06-04 DIAGNOSIS — R972 Elevated prostate specific antigen [PSA]: Secondary | ICD-10-CM | POA: Diagnosis not present

## 2022-06-04 DIAGNOSIS — N4341 Spermatocele of epididymis, single: Secondary | ICD-10-CM | POA: Diagnosis not present

## 2022-06-26 DIAGNOSIS — M1611 Unilateral primary osteoarthritis, right hip: Secondary | ICD-10-CM | POA: Diagnosis not present

## 2022-07-18 DIAGNOSIS — H0288A Meibomian gland dysfunction right eye, upper and lower eyelids: Secondary | ICD-10-CM | POA: Diagnosis not present

## 2022-07-18 DIAGNOSIS — H1045 Other chronic allergic conjunctivitis: Secondary | ICD-10-CM | POA: Diagnosis not present

## 2022-07-18 DIAGNOSIS — H2513 Age-related nuclear cataract, bilateral: Secondary | ICD-10-CM | POA: Diagnosis not present

## 2022-07-18 DIAGNOSIS — H0288B Meibomian gland dysfunction left eye, upper and lower eyelids: Secondary | ICD-10-CM | POA: Diagnosis not present

## 2022-09-04 DIAGNOSIS — S92514A Nondisplaced fracture of proximal phalanx of right lesser toe(s), initial encounter for closed fracture: Secondary | ICD-10-CM | POA: Diagnosis not present

## 2022-10-22 DIAGNOSIS — E349 Endocrine disorder, unspecified: Secondary | ICD-10-CM | POA: Diagnosis not present

## 2022-10-22 DIAGNOSIS — K219 Gastro-esophageal reflux disease without esophagitis: Secondary | ICD-10-CM | POA: Diagnosis not present

## 2022-10-22 DIAGNOSIS — Z125 Encounter for screening for malignant neoplasm of prostate: Secondary | ICD-10-CM | POA: Diagnosis not present

## 2022-10-22 DIAGNOSIS — Z1212 Encounter for screening for malignant neoplasm of rectum: Secondary | ICD-10-CM | POA: Diagnosis not present

## 2022-10-22 DIAGNOSIS — E785 Hyperlipidemia, unspecified: Secondary | ICD-10-CM | POA: Diagnosis not present

## 2022-10-29 DIAGNOSIS — C4491 Basal cell carcinoma of skin, unspecified: Secondary | ICD-10-CM | POA: Diagnosis not present

## 2022-10-29 DIAGNOSIS — Z23 Encounter for immunization: Secondary | ICD-10-CM | POA: Diagnosis not present

## 2022-10-29 DIAGNOSIS — M179 Osteoarthritis of knee, unspecified: Secondary | ICD-10-CM | POA: Diagnosis not present

## 2022-10-29 DIAGNOSIS — E785 Hyperlipidemia, unspecified: Secondary | ICD-10-CM | POA: Diagnosis not present

## 2022-10-29 DIAGNOSIS — K219 Gastro-esophageal reflux disease without esophagitis: Secondary | ICD-10-CM | POA: Diagnosis not present

## 2022-10-29 DIAGNOSIS — Z1331 Encounter for screening for depression: Secondary | ICD-10-CM | POA: Diagnosis not present

## 2022-10-29 DIAGNOSIS — Z0000 Encounter for general adult medical examination without abnormal findings: Secondary | ICD-10-CM | POA: Diagnosis not present

## 2022-10-29 DIAGNOSIS — Z1339 Encounter for screening examination for other mental health and behavioral disorders: Secondary | ICD-10-CM | POA: Diagnosis not present

## 2023-02-06 DIAGNOSIS — D485 Neoplasm of uncertain behavior of skin: Secondary | ICD-10-CM | POA: Diagnosis not present

## 2023-02-06 DIAGNOSIS — Z85828 Personal history of other malignant neoplasm of skin: Secondary | ICD-10-CM | POA: Diagnosis not present

## 2023-02-06 DIAGNOSIS — L821 Other seborrheic keratosis: Secondary | ICD-10-CM | POA: Diagnosis not present

## 2023-02-06 DIAGNOSIS — L570 Actinic keratosis: Secondary | ICD-10-CM | POA: Diagnosis not present

## 2023-02-06 DIAGNOSIS — L812 Freckles: Secondary | ICD-10-CM | POA: Diagnosis not present

## 2023-02-06 DIAGNOSIS — C44222 Squamous cell carcinoma of skin of right ear and external auricular canal: Secondary | ICD-10-CM | POA: Diagnosis not present

## 2023-02-06 DIAGNOSIS — D044 Carcinoma in situ of skin of scalp and neck: Secondary | ICD-10-CM | POA: Diagnosis not present

## 2023-11-03 HISTORY — PX: ELBOW LIGAMENT RECONSTRUCTION: SHX6359

## 2023-11-18 ENCOUNTER — Encounter (HOSPITAL_BASED_OUTPATIENT_CLINIC_OR_DEPARTMENT_OTHER): Payer: Self-pay | Admitting: Orthopaedic Surgery

## 2023-11-18 ENCOUNTER — Other Ambulatory Visit: Payer: Self-pay
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
## 2023-11-20 ENCOUNTER — Encounter (HOSPITAL_BASED_OUTPATIENT_CLINIC_OR_DEPARTMENT_OTHER): Admission: RE | Payer: Self-pay | Source: Home / Self Care

## 2023-11-20 ENCOUNTER — Ambulatory Visit (HOSPITAL_BASED_OUTPATIENT_CLINIC_OR_DEPARTMENT_OTHER): Admission: RE | Admit: 2023-11-20 | Source: Home / Self Care | Admitting: Orthopaedic Surgery

## 2023-11-20 DIAGNOSIS — Z01818 Encounter for other preprocedural examination: Secondary | ICD-10-CM

## 2023-11-20 SURGERY — INCISION AND DRAINAGE, ABSCESS
Anesthesia: General | Laterality: Right

## 2024-03-02 ENCOUNTER — Ambulatory Visit: Admitting: Podiatry

## 2024-03-02 ENCOUNTER — Ambulatory Visit (INDEPENDENT_AMBULATORY_CARE_PROVIDER_SITE_OTHER)

## 2024-03-02 ENCOUNTER — Encounter: Payer: Self-pay | Admitting: Podiatry

## 2024-03-02 DIAGNOSIS — M2041 Other hammer toe(s) (acquired), right foot: Secondary | ICD-10-CM | POA: Diagnosis not present

## 2024-03-02 DIAGNOSIS — B351 Tinea unguium: Secondary | ICD-10-CM | POA: Diagnosis not present

## 2024-03-02 MED ORDER — EFINACONAZOLE 10 % EX SOLN: 1.0000 [drp] | Freq: Every day | CUTANEOUS | 11 refills | Status: DC
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
## 2024-03-10 ENCOUNTER — Ambulatory Visit: Admitting: Podiatry

## 2024-03-10 DIAGNOSIS — M2041 Other hammer toe(s) (acquired), right foot: Secondary | ICD-10-CM

## 2024-03-10 DIAGNOSIS — B351 Tinea unguium: Secondary | ICD-10-CM

## 2024-03-10 MED ORDER — EFINACONAZOLE 10 % EX SOLN: 1.0000 [drp] | Freq: Every day | CUTANEOUS | 11 refills | Status: AC

## 2024-03-10 NOTE — Progress Notes (Signed)
  Subjective:  Patient ID: Patrick [name], male    DOB: Mar 31, 1956,  MRN: [id number]  No chief complaint on file.    History of Present Illness Patrick [name] is a 68 year old male who presents with toe pain and mallet toe deformity.  He presents today for flexor tenotomy of his right second toe.  He is attempted numerous conservative treatments without any significant.     Objective:    Physical Exam  General: AAO x3, NAD  Dermatological: Nails are hypertrophic, dystrophic with yellow, brown discoloration.  No edema, erythema.  No open lesions.  Vascular: Dorsalis Pedis artery and Posterior Tibial artery pedal pulses are 2/4 bilateral with immedate capillary fill time.  There is no pain with calf compression, swelling, warmth, erythema.   Neruologic: Grossly intact via light touch bilateral.   Musculoskeletal: Mallet toe deformity noted of the right second toe with tenderness to palpation along the DIPJ.  No edema or erythema.  There is no other areas of pinpoint tenderness identified.  Gait: Unassisted, Nonantalgic.  Assessment:   1. Hammertoe of right foot   2.      Onychomycosis  Plan:  Patient was evaluated and treated and all questions answered.  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  -We discussed flexor tenotomy he wishes to proceed today.  Discussed alternatives, risks, complications.  Consent was signed.  I cleaned the skin with alcohol  mixture of 3 mL of lidocaine , Marcaine  plain was infiltrated in a digital block fashion.  I then prepped the toe with Betadine.  Tourniquet applied.  I then introduced an 18-gauge needle to the plantar aspect the toe at the level of the DIPJ to transect the tendon.  The toe was sitting in a more rectus position.  I irrigated with alcohol .  I utilized Dermabond to close the wound followed by bandage.  Tourniquet was released and there is found to  be an immediate capillary fill time of the toe.  Tolerated well.  Dispensed toe crest to help some pressure.  Discussed daily dressing changes.  Onychomycosis -He did not get this. Will refill.   Return in about 2 weeks (around 03/24/2024) for follow up of flexor tenotomy.  [name] Fees DPM    - with conservative as well as surgical treatment options.  Attempted numerous conservative options.  Discussed surgical intervention including tried a flexor tenotomy if the toe is still flexible versus arthroplasty.  He wants to attempt the flexor tenotomy first. - Schedule tendon release procedure for next Wednesday at 3:15 PM. - Continue taping procedure post-tendon release to maintain toe position. - Discuss potential arthroplasty if tendon release is ineffective or causes complications.  Fungal infection of the nail Chronic fungal infection. Jublia  recommended but cost-prohibitive. - Send Jublia  prescription to [facility] in [place] for price evaluation. - If Jublia  remains expensive, consider prescribing keratin as an alternative. - Await pharmacy's call regarding Jublia  pricing and insurance coverage.   No follow-ups on file.   [name] Fees DPM

## 2024-03-12 ENCOUNTER — Other Ambulatory Visit: Payer: Self-pay | Admitting: Podiatry

## 2024-03-12 MED ORDER — TAVABOROLE 5 % EX SOLN: 1.0000 | Freq: Every day | CUTANEOUS | 2 refills | Status: AC

## 2024-03-12 NOTE — Progress Notes (Signed)
 Sent Kerydin per pharmacy due to cost

## 2024-03-13 ENCOUNTER — Encounter: Payer: Self-pay | Admitting: Internal Medicine

## 2024-03-15 ENCOUNTER — Telehealth: Payer: Self-pay

## 2024-03-15 NOTE — Telephone Encounter (Signed)
 Received faxed request from [facility] - Jublia  is not eligible for coupon and copay is too expensive. Suggested cost effective alternative is: Tavaborole  5% solution, 10 ml bottle, cost $50.00

## 2024-03-25 ENCOUNTER — Ambulatory Visit: Admitting: Podiatry

## 2024-04-26 ENCOUNTER — Encounter: Payer: Self-pay | Admitting: Internal Medicine

## 2024-05-19 ENCOUNTER — Ambulatory Visit

## 2024-05-19 VITALS — Ht 73.0 in | Wt 195.0 lb

## 2024-05-19 DIAGNOSIS — Z8719 Personal history of other diseases of the digestive system: Secondary | ICD-10-CM

## 2024-05-19 DIAGNOSIS — K625 Hemorrhage of anus and rectum: Secondary | ICD-10-CM

## 2024-05-19 DIAGNOSIS — Z860100 Personal history of colon polyps, unspecified: Secondary | ICD-10-CM

## 2024-05-19 MED ORDER — NA SULFATE-K SULFATE-MG SULF 17.5-3.13-1.6 GM/177ML PO SOLN: 1.0000 | Freq: Once | ORAL | 0 refills | Status: AC

## 2024-05-19 NOTE — Progress Notes (Signed)

## 2024-05-26 ENCOUNTER — Encounter: Payer: Self-pay | Admitting: Internal Medicine

## 2024-06-02 ENCOUNTER — Telehealth: Payer: Self-pay | Admitting: Internal Medicine

## 2024-06-02 ENCOUNTER — Ambulatory Visit: Admitting: Internal Medicine

## 2024-06-02 ENCOUNTER — Encounter: Payer: Self-pay | Admitting: Internal Medicine

## 2024-06-02 VITALS — BP 131/82 | HR 78 | Temp 98.4°F | Resp 14 | Ht 73.0 in | Wt 195.0 lb

## 2024-06-02 DIAGNOSIS — K648 Other hemorrhoids: Secondary | ICD-10-CM | POA: Diagnosis not present

## 2024-06-02 DIAGNOSIS — K635 Polyp of colon: Secondary | ICD-10-CM

## 2024-06-02 DIAGNOSIS — K5730 Diverticulosis of large intestine without perforation or abscess without bleeding: Secondary | ICD-10-CM | POA: Diagnosis not present

## 2024-06-02 DIAGNOSIS — Z1211 Encounter for screening for malignant neoplasm of colon: Secondary | ICD-10-CM

## 2024-06-02 DIAGNOSIS — Z860101 Personal history of adenomatous and serrated colon polyps: Secondary | ICD-10-CM

## 2024-06-02 DIAGNOSIS — Z860100 Personal history of colon polyps, unspecified: Secondary | ICD-10-CM

## 2024-06-02 DIAGNOSIS — D122 Benign neoplasm of ascending colon: Secondary | ICD-10-CM

## 2024-06-02 MED ORDER — SODIUM CHLORIDE 0.9 % IV SOLN: 500.0000 mL | INTRAVENOUS | Status: DC

## 2024-06-02 NOTE — Progress Notes (Signed)
 Report to PACU, RN, vss, BBS= Clear.

## 2024-06-02 NOTE — Patient Instructions (Signed)
-  Handout on polyps provided -await pathology results -repeat colonoscopy in 5 years for surveillance recommended.  -Continue present medications .  YOU HAD AN ENDOSCOPIC PROCEDURE TODAY AT [facility]:   Refer to the procedure report that was given to you for any specific questions about what was found during the examination.  If the procedure report does not answer your questions, please call your gastroenterologist to clarify.  If you requested that your care partner not be given the details of your procedure findings, then the procedure report has been included in a sealed envelope for you to review at your convenience later.  YOU SHOULD EXPECT: Some feelings of bloating in the abdomen. Passage of more gas than usual.  Walking can help get rid of the air that was put into your GI tract during the procedure and reduce the bloating. If you had a lower endoscopy (such as a colonoscopy or flexible sigmoidoscopy) you may notice spotting of blood in your stool or on the toilet paper. If you underwent a bowel prep for your procedure, you may not have a normal bowel movement for a few days.  Please Note:  You might notice some irritation and congestion in your nose or some drainage.  This is from the oxygen used during your procedure.  There is no need for concern and it should clear up in a day or so.  SYMPTOMS TO REPORT IMMEDIATELY:  Following lower endoscopy (colonoscopy or flexible sigmoidoscopy):  Excessive amounts of blood in the stool  Significant tenderness or worsening of abdominal pains  Swelling of the abdomen that is new, acute  Fever of 100F or higher  For urgent or emergent issues, a gastroenterologist can be reached at any hour by calling [phone]. Do not use MyChart messaging for urgent concerns.    DIET:  We do recommend a small meal at first, but then you may proceed to your regular diet.  Drink plenty of fluids but you should avoid alcoholic beverages for  24 hours.  ACTIVITY:  You should plan to take it easy for the rest of today and you should NOT DRIVE or use heavy machinery until tomorrow (because of the sedation medicines used during the test).    FOLLOW UP: Our staff will call the number listed on your records the next business day following your procedure.  We will call around 7:15- 8:00 am to check on you and address any questions or concerns that you may have regarding the information given to you following your procedure. If we do not reach you, we will leave a message.     If any biopsies were taken you will be contacted by phone or by letter within the next 1-3 weeks.  Please call us at [phone] if you have not heard about the biopsies in 3 weeks.    SIGNATURES/CONFIDENTIALITY: You and/or your care partner have signed paperwork which will be entered into your electronic medical record.  These signatures attest to the fact that that the information above on your After Visit Summary has been reviewed and is understood.  Full responsibility of the confidentiality of this discharge information lies with you and/or your care-partner.

## 2024-06-02 NOTE — Progress Notes (Signed)
 Pt's states no medical or surgical changes since previsit or office visit.

## 2024-06-02 NOTE — Telephone Encounter (Signed)
 Returned the patient's phone call. He underwent Colonoscopy with Dr. Abran this morning. He was c/o gas pain when he left the Center around noon. He did purchase some Gas-X from the Pharmacy and did not notice any improvement. He has been mobile and is eating and drinking ok. Over the past hour he feels like he is able to pass some gas and is feeling some better. He reports that his pain is a 4 currently but will fluctuate to a 9/10. Will make Dr. Abran aware.

## 2024-06-02 NOTE — Progress Notes (Signed)
 Called to room to assist during endoscopic procedure.  Patient ID and intended procedure confirmed with present staff. Received instructions for my participation in the procedure from the performing physician.

## 2024-06-02 NOTE — Telephone Encounter (Signed)
 Spoke with Dr. Abran. Patient is to continue to monitor this discomfort and if he is not seeing any improvement that he should go to the ED. Will follow up with the patient and relay this information.

## 2024-06-02 NOTE — Telephone Encounter (Signed)
 Yes, continue close observation. Hopefully, discomfort will continue to improve. Routine follow up call in am. Thanks

## 2024-06-02 NOTE — Op Note (Signed)
 [facility] Patient Name: Patrick [name] Procedure Date: 06/02/2024 10:46 AM MRN: [id number] Endoscopist: Norleen SAILOR. Abran , MD, [id number] Age: 68 Referring MD:  Date of Birth: 17-Jul-1956 Gender: Male Account #: [id number] Procedure:                Colonoscopy with cold snare polypectomy x 1 Indications:              High risk colon cancer surveillance: Personal                            history of non-advanced adenomas, High risk colon                            cancer surveillance: Personal history of sessile                            serrated colon polyp (less than 10 mm in size) with                            no dysplasia. Previous examinations 2014, 2020 Medicines:                Monitored Anesthesia Care Procedure:                Pre-Anesthesia Assessment:                           - Prior to the procedure, a History and Physical                            was performed, and patient medications and                            allergies were reviewed. The patient's tolerance of                            previous anesthesia was also reviewed. The risks                            and benefits of the procedure and the sedation                            options and risks were discussed with the patient.                            All questions were answered, and informed consent                            was obtained. Prior Anticoagulants: The patient has                            taken no anticoagulant or antiplatelet agents. ASA                            Grade Assessment: II - A patient with mild systemic  disease. After reviewing the risks and benefits,                            the patient was deemed in satisfactory condition to                            undergo the procedure.                           After obtaining informed consent, the colonoscope                            was passed under direct vision. Throughout the                             procedure, the patient's blood pressure, pulse, and                            oxygen saturations were monitored continuously. The                            Olympus Scope [id number] was introduced through the                            anus and advanced to the the cecum, identified by                            appendiceal orifice and ileocecal valve. The                            ileocecal valve, appendiceal orifice, and rectum                            were photographed. The quality of the bowel                            preparation was excellent. The colonoscopy was                            performed without difficulty. The patient tolerated                            the procedure well. The bowel preparation used was                            SUPREP via split dose instruction. Scope In: 10:54:56 AM Scope Out: 11:05:11 AM Scope Withdrawal Time: 0 hours 7 minutes 52 seconds  Total Procedure Duration: 0 hours 10 minutes 15 seconds  Findings:                 A 4 mm polyp was found in the ascending colon. The                            polyp was sessile. The polyp was removed with  a                            cold snare. Resection and retrieval were complete.                           Many diverticula were found in the transverse colon                            and left colon.                           Internal hemorrhoids were found during retroflexion.                           The exam was otherwise without abnormality on                            direct and retroflexion views. Complications:            No immediate complications. Estimated blood loss:                            None. Estimated Blood Loss:     Estimated blood loss: none. Impression:               - One 4 mm polyp in the ascending colon, removed                            with a cold snare. Resected and retrieved.                           - Diverticulosis in the transverse colon and in the                             left colon.                           - Internal hemorrhoids.                           - The examination was otherwise normal on direct                            and retroflexion views. Recommendation:           - Repeat colonoscopy in 5 years for surveillance.                           - Patient has a contact number available for                            emergencies. The signs and symptoms of potential                            delayed complications were discussed with the  patient. Return to normal activities tomorrow.                            Written discharge instructions were provided to the                            patient.                           - Resume previous diet.                           - Continue present medications.                           - Await pathology results. Norleen SAILOR. Abran, MD 06/02/2024 11:10:12 AM This report has been signed electronically.

## 2024-06-02 NOTE — Progress Notes (Signed)
 Pt passing air and states  I feel better and I'm ready to leave. Pt discharge home. ABD soft and non distended

## 2024-06-02 NOTE — Telephone Encounter (Signed)
 Patient states he is having pain and various symptoms from today's procedure. Requesting to speak with a nurse. Please advise.
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
## 2024-06-03 ENCOUNTER — Telehealth: Payer: Self-pay | Admitting: *Deleted

## 2024-06-03 NOTE — Telephone Encounter (Signed)
 No answer for post procedure followup call. Left VM. ?

## 2024-06-04 LAB — SURGICAL PATHOLOGY

## 2024-06-09 ENCOUNTER — Ambulatory Visit: Payer: Self-pay | Admitting: Internal Medicine
